# Patient Record
Sex: Female | Born: 1945 | Race: Black or African American | Hispanic: No | Smoking: Never smoker
Health system: Southern US, Community
[De-identification: ages and names within clinical notes are randomized; demographics above are authoritative.]

## PROBLEM LIST (undated history)

## (undated) DIAGNOSIS — F419 Anxiety disorder, unspecified: Secondary | ICD-10-CM

## (undated) DIAGNOSIS — R011 Cardiac murmur, unspecified: Secondary | ICD-10-CM

## (undated) DIAGNOSIS — K219 Gastro-esophageal reflux disease without esophagitis: Secondary | ICD-10-CM

## (undated) DIAGNOSIS — Z8719 Personal history of other diseases of the digestive system: Secondary | ICD-10-CM

## (undated) HISTORY — PX: ABDOMINAL HYSTERECTOMY: SHX81

## (undated) HISTORY — PX: UPPER GI ENDOSCOPY: SHX6162

## (undated) HISTORY — PX: BREAST EXCISIONAL BIOPSY: SUR124

## (undated) HISTORY — PX: COLONOSCOPY: SHX174

---

## 1997-11-28 ENCOUNTER — Inpatient Hospital Stay (HOSPITAL_COMMUNITY): Admission: RE | Admit: 1997-11-28 | Discharge: 1997-12-01 | Payer: Self-pay | Admitting: Obstetrics and Gynecology

## 1999-10-01 ENCOUNTER — Ambulatory Visit (HOSPITAL_COMMUNITY): Admission: RE | Admit: 1999-10-01 | Discharge: 1999-10-01 | Payer: Self-pay | Admitting: Internal Medicine

## 1999-10-01 ENCOUNTER — Encounter: Payer: Self-pay | Admitting: Internal Medicine

## 1999-10-22 ENCOUNTER — Encounter (INDEPENDENT_AMBULATORY_CARE_PROVIDER_SITE_OTHER): Payer: Self-pay | Admitting: *Deleted

## 1999-10-22 ENCOUNTER — Ambulatory Visit (HOSPITAL_COMMUNITY): Admission: RE | Admit: 1999-10-22 | Discharge: 1999-10-22 | Payer: Self-pay | Admitting: Internal Medicine

## 2000-01-17 ENCOUNTER — Ambulatory Visit (HOSPITAL_COMMUNITY): Admission: RE | Admit: 2000-01-17 | Discharge: 2000-01-17 | Payer: Self-pay | Admitting: Emergency Medicine

## 2000-01-30 ENCOUNTER — Encounter: Payer: Self-pay | Admitting: Obstetrics and Gynecology

## 2000-01-30 ENCOUNTER — Encounter: Admission: RE | Admit: 2000-01-30 | Discharge: 2000-01-30 | Payer: Self-pay | Admitting: Obstetrics and Gynecology

## 2000-03-26 ENCOUNTER — Other Ambulatory Visit: Admission: RE | Admit: 2000-03-26 | Discharge: 2000-03-26 | Payer: Self-pay | Admitting: Obstetrics and Gynecology

## 2001-04-15 ENCOUNTER — Encounter: Admission: RE | Admit: 2001-04-15 | Discharge: 2001-04-15 | Payer: Self-pay | Admitting: Obstetrics and Gynecology

## 2001-04-15 ENCOUNTER — Encounter: Payer: Self-pay | Admitting: Obstetrics and Gynecology

## 2001-04-20 ENCOUNTER — Other Ambulatory Visit: Admission: RE | Admit: 2001-04-20 | Discharge: 2001-04-20 | Payer: Self-pay | Admitting: Obstetrics and Gynecology

## 2001-05-12 ENCOUNTER — Encounter: Payer: Self-pay | Admitting: Urology

## 2001-05-12 ENCOUNTER — Encounter: Admission: RE | Admit: 2001-05-12 | Discharge: 2001-05-12 | Payer: Self-pay | Admitting: Urology

## 2001-11-18 ENCOUNTER — Other Ambulatory Visit: Admission: RE | Admit: 2001-11-18 | Discharge: 2001-11-18 | Payer: Self-pay | Admitting: Urology

## 2003-10-09 ENCOUNTER — Emergency Department (HOSPITAL_COMMUNITY): Admission: AD | Admit: 2003-10-09 | Discharge: 2003-10-09 | Payer: Self-pay | Admitting: Internal Medicine

## 2004-09-17 ENCOUNTER — Encounter: Admission: RE | Admit: 2004-09-17 | Discharge: 2004-09-17 | Payer: Self-pay | Admitting: Obstetrics and Gynecology

## 2006-07-31 ENCOUNTER — Encounter: Admission: RE | Admit: 2006-07-31 | Discharge: 2006-07-31 | Payer: Self-pay | Admitting: Emergency Medicine

## 2006-09-11 ENCOUNTER — Ambulatory Visit (HOSPITAL_COMMUNITY): Admission: RE | Admit: 2006-09-11 | Discharge: 2006-09-11 | Payer: Self-pay | Admitting: Emergency Medicine

## 2006-09-11 ENCOUNTER — Encounter (INDEPENDENT_AMBULATORY_CARE_PROVIDER_SITE_OTHER): Payer: Self-pay | Admitting: *Deleted

## 2008-01-13 ENCOUNTER — Emergency Department (HOSPITAL_COMMUNITY): Admission: EM | Admit: 2008-01-13 | Discharge: 2008-01-13 | Payer: Self-pay | Admitting: Emergency Medicine

## 2008-04-25 ENCOUNTER — Emergency Department (HOSPITAL_COMMUNITY): Admission: EM | Admit: 2008-04-25 | Discharge: 2008-04-25 | Payer: Self-pay | Admitting: Emergency Medicine

## 2009-05-29 ENCOUNTER — Emergency Department (HOSPITAL_COMMUNITY): Admission: EM | Admit: 2009-05-29 | Discharge: 2009-05-29 | Payer: Self-pay | Admitting: Family Medicine

## 2010-12-11 ENCOUNTER — Other Ambulatory Visit (HOSPITAL_COMMUNITY): Payer: Self-pay | Admitting: Internal Medicine

## 2010-12-11 DIAGNOSIS — Z1231 Encounter for screening mammogram for malignant neoplasm of breast: Secondary | ICD-10-CM

## 2010-12-19 ENCOUNTER — Ambulatory Visit (HOSPITAL_COMMUNITY)
Admission: RE | Admit: 2010-12-19 | Discharge: 2010-12-19 | Disposition: A | Discharge status: Home / Self Care | Payer: Medicare Other | Source: Ambulatory Visit | Attending: Internal Medicine | Admitting: Internal Medicine

## 2010-12-19 DIAGNOSIS — Z1231 Encounter for screening mammogram for malignant neoplasm of breast: Secondary | ICD-10-CM | POA: Insufficient documentation

## 2011-03-01 NOTE — Procedures (Signed)
[facility]  Patient:    Danielle Sexton                    MRN: [id number] Proc. Date: 10/22/99 Adm. Date:  [id number] Attending:  Mervin Hack CC:         Reuben Likes, M.D.                           Procedure Report  PROCEDURE:  Upper endoscopy and colonoscopy.  INDICATIONS:  This 65 year old black female has history of H.pylori gastropathy, history of gastric ulcer documented on upper endoscopies last one in 1997.  She has a recurrence of epigastric pain.  She has had empirical course of Bioxin and Prevacid for H.pylori in January of 2000, and again five weeks ago with only minimal improvement of her symptoms.  Her ultrasound of the gallbladder was negative.  She is now undergoing upper endoscopy to further avoid her symptoms.  She is also undergoing colonoscopy for neoplastic screening.  ENDOSCOPE:  Fujinon single channel videoscope.  SEDATION:  Versed 9 mg IV.  Demerol 50 mg IV.  FINDINGS:  Fujinon single channel videoscope passed under direct visualization through the posterior pharynx and into the esophagus.  The patient was monitored by pulse oximeter.  Her oxygen saturations were between 98 to 99%.  She was very cooperative.  Proximal and distal esophagus was normal.  Mucosa was glistening.  There were no erosions.  Z-line showed only one short tongue of gastric mucosa.  The gastric site of the GE junction appeared somewhat erythematous.  Biopsies were taken from the tongue of the gastric mucosa.  Stomach.  The stomach was insufflated with air.  There was no significant hiatal hernia.  Gastric folds were normal.  Small amount of bowel exited from the pyloris into the gastric body.  There was mild erythema in the gastric antrum.  Repeat CLOtest was done.  Retroflexion of endoscope revealed normal fundus and cardia.  Duodenum.  Duodenal bulb and descending duodenum was unremarkable.  IMPRESSION:  Gastritis,  status post biopsies and CLOtest.  COLONOSCOPY  ENDOSCOPE:  Fujinon single channel videoscope.  SEDATION:  Versed 3.5 mg IV, Demerol 50 mg IV.  FINDINGS:  The Fujinon single channel videoscope passed under direct vision through the rectum to the sigmoid colon.  The patient was again monitored by pulse oximeter.  Her oxygen saturations were normal.  Her prep was excellent.  Rectum  showed prominent hemorrhoidal veins with one speculation of the dentate line. isx was achieved by retroflexing the endoscope in the rectum.  There was scattered diverticuli throughout the sigmoid colon and also throughout the entire colon including cecum.  The mucosa appeared normal throughout the colon.  There were o polyps.  Colonoscope passed with ease through the splenic flexure, transverse colon, hepatic flexure, through the ascending colon to the cecum.  The cecal pouch was visualized thoroughly including ileocecal valve.  Diverticuli were detected  surrounding the ileocecal valve.  The colonoscope was then slowly retracted and  colon decompressed.  The patient withstood the procedure well.  IMPRESSION: 1. Mild Universal diverticulosis of the left and right colon. 2. Small internal hemorrhoids.  PLAN:  1) The upper endoscopy as well as colonoscopy shows significant lesions. The epigastric pain is most likely functional.  We will add antispasmotics.  As far as the diverticulosis is concerned, the patient was given high fiber diet and suggested to begin fiber  supplements to minimize the progression of the diverticulosis.  I suggested repeat colonoscopy in 10 years. DD:  10/22/99 TD:  10/22/99 Job: [id number]

## 2011-11-20 ENCOUNTER — Other Ambulatory Visit (HOSPITAL_COMMUNITY): Payer: Self-pay | Admitting: Nurse Practitioner

## 2011-11-20 DIAGNOSIS — Z1231 Encounter for screening mammogram for malignant neoplasm of breast: Secondary | ICD-10-CM

## 2011-12-24 ENCOUNTER — Ambulatory Visit (HOSPITAL_COMMUNITY)
Admission: RE | Admit: 2011-12-24 | Discharge: 2011-12-24 | Disposition: A | Discharge status: Home / Self Care | Payer: Medicare Other | Source: Ambulatory Visit | Attending: Nurse Practitioner | Admitting: Nurse Practitioner

## 2011-12-24 DIAGNOSIS — Z1231 Encounter for screening mammogram for malignant neoplasm of breast: Secondary | ICD-10-CM | POA: Insufficient documentation

## 2011-12-30 ENCOUNTER — Ambulatory Visit (HOSPITAL_COMMUNITY): Payer: Medicare Other

## 2014-04-12 ENCOUNTER — Other Ambulatory Visit (HOSPITAL_COMMUNITY): Payer: Self-pay | Admitting: Internal Medicine

## 2014-04-12 DIAGNOSIS — Z1231 Encounter for screening mammogram for malignant neoplasm of breast: Secondary | ICD-10-CM

## 2014-04-14 ENCOUNTER — Ambulatory Visit (HOSPITAL_COMMUNITY)
Admission: RE | Admit: 2014-04-14 | Discharge: 2014-04-14 | Disposition: A | Discharge status: Home / Self Care | Payer: Medicare HMO | Source: Ambulatory Visit | Attending: Internal Medicine | Admitting: Internal Medicine

## 2014-04-14 DIAGNOSIS — Z1231 Encounter for screening mammogram for malignant neoplasm of breast: Secondary | ICD-10-CM | POA: Insufficient documentation

## 2015-12-12 DIAGNOSIS — I119 Hypertensive heart disease without heart failure: Secondary | ICD-10-CM | POA: Diagnosis not present

## 2015-12-12 DIAGNOSIS — I10 Essential (primary) hypertension: Secondary | ICD-10-CM | POA: Diagnosis not present

## 2016-01-11 DIAGNOSIS — I119 Hypertensive heart disease without heart failure: Secondary | ICD-10-CM | POA: Diagnosis not present

## 2016-01-11 DIAGNOSIS — I10 Essential (primary) hypertension: Secondary | ICD-10-CM | POA: Diagnosis not present

## 2016-01-12 DIAGNOSIS — Z136 Encounter for screening for cardiovascular disorders: Secondary | ICD-10-CM | POA: Diagnosis not present

## 2016-01-12 DIAGNOSIS — I10 Essential (primary) hypertension: Secondary | ICD-10-CM | POA: Diagnosis not present

## 2016-01-12 DIAGNOSIS — Z0100 Encounter for examination of eyes and vision without abnormal findings: Secondary | ICD-10-CM | POA: Diagnosis not present

## 2016-01-12 DIAGNOSIS — K219 Gastro-esophageal reflux disease without esophagitis: Secondary | ICD-10-CM | POA: Diagnosis not present

## 2016-01-12 DIAGNOSIS — I119 Hypertensive heart disease without heart failure: Secondary | ICD-10-CM | POA: Diagnosis not present

## 2016-01-12 DIAGNOSIS — Z1389 Encounter for screening for other disorder: Secondary | ICD-10-CM | POA: Diagnosis not present

## 2016-01-12 DIAGNOSIS — F419 Anxiety disorder, unspecified: Secondary | ICD-10-CM | POA: Diagnosis not present

## 2016-01-12 DIAGNOSIS — Z0000 Encounter for general adult medical examination without abnormal findings: Secondary | ICD-10-CM | POA: Diagnosis not present

## 2016-01-12 DIAGNOSIS — Z01118 Encounter for examination of ears and hearing with other abnormal findings: Secondary | ICD-10-CM | POA: Diagnosis not present

## 2016-01-12 DIAGNOSIS — Z131 Encounter for screening for diabetes mellitus: Secondary | ICD-10-CM | POA: Diagnosis not present

## 2016-01-18 DIAGNOSIS — I10 Essential (primary) hypertension: Secondary | ICD-10-CM | POA: Diagnosis not present

## 2016-01-18 DIAGNOSIS — I119 Hypertensive heart disease without heart failure: Secondary | ICD-10-CM | POA: Diagnosis not present

## 2016-02-08 DIAGNOSIS — H04123 Dry eye syndrome of bilateral lacrimal glands: Secondary | ICD-10-CM | POA: Diagnosis not present

## 2016-02-08 DIAGNOSIS — H40033 Anatomical narrow angle, bilateral: Secondary | ICD-10-CM | POA: Diagnosis not present

## 2016-02-15 DIAGNOSIS — I10 Essential (primary) hypertension: Secondary | ICD-10-CM | POA: Diagnosis not present

## 2016-02-15 DIAGNOSIS — I119 Hypertensive heart disease without heart failure: Secondary | ICD-10-CM | POA: Diagnosis not present

## 2016-03-18 DIAGNOSIS — I10 Essential (primary) hypertension: Secondary | ICD-10-CM | POA: Diagnosis not present

## 2016-03-18 DIAGNOSIS — I119 Hypertensive heart disease without heart failure: Secondary | ICD-10-CM | POA: Diagnosis not present

## 2016-04-18 DIAGNOSIS — I119 Hypertensive heart disease without heart failure: Secondary | ICD-10-CM | POA: Diagnosis not present

## 2016-04-18 DIAGNOSIS — I10 Essential (primary) hypertension: Secondary | ICD-10-CM | POA: Diagnosis not present

## 2018-05-04 DIAGNOSIS — I10 Essential (primary) hypertension: Secondary | ICD-10-CM | POA: Diagnosis not present

## 2018-05-04 DIAGNOSIS — M1611 Unilateral primary osteoarthritis, right hip: Secondary | ICD-10-CM | POA: Diagnosis not present

## 2018-10-05 ENCOUNTER — Other Ambulatory Visit: Payer: Self-pay | Admitting: Family Medicine

## 2018-10-05 DIAGNOSIS — Z1231 Encounter for screening mammogram for malignant neoplasm of breast: Secondary | ICD-10-CM

## 2018-10-27 DIAGNOSIS — E7800 Pure hypercholesterolemia, unspecified: Secondary | ICD-10-CM | POA: Diagnosis not present

## 2018-10-27 DIAGNOSIS — Z0000 Encounter for general adult medical examination without abnormal findings: Secondary | ICD-10-CM | POA: Diagnosis not present

## 2018-10-27 DIAGNOSIS — I10 Essential (primary) hypertension: Secondary | ICD-10-CM | POA: Diagnosis not present

## 2018-10-27 DIAGNOSIS — Z1389 Encounter for screening for other disorder: Secondary | ICD-10-CM | POA: Diagnosis not present

## 2018-10-29 DIAGNOSIS — H524 Presbyopia: Secondary | ICD-10-CM | POA: Diagnosis not present

## 2018-11-10 ENCOUNTER — Ambulatory Visit
Admission: RE | Admit: 2018-11-10 | Discharge: 2018-11-10 | Disposition: A | Discharge status: Home / Self Care | Payer: Medicare HMO | Source: Ambulatory Visit | Attending: Family Medicine | Admitting: Family Medicine

## 2018-11-10 DIAGNOSIS — Z1231 Encounter for screening mammogram for malignant neoplasm of breast: Secondary | ICD-10-CM | POA: Diagnosis not present

## 2019-03-23 DIAGNOSIS — H5203 Hypermetropia, bilateral: Secondary | ICD-10-CM | POA: Diagnosis not present

## 2019-03-23 DIAGNOSIS — H52209 Unspecified astigmatism, unspecified eye: Secondary | ICD-10-CM | POA: Diagnosis not present

## 2019-03-23 DIAGNOSIS — H524 Presbyopia: Secondary | ICD-10-CM | POA: Diagnosis not present

## 2019-04-20 DIAGNOSIS — I10 Essential (primary) hypertension: Secondary | ICD-10-CM | POA: Diagnosis not present

## 2019-05-11 DIAGNOSIS — I10 Essential (primary) hypertension: Secondary | ICD-10-CM | POA: Diagnosis not present

## 2019-05-11 DIAGNOSIS — Z23 Encounter for immunization: Secondary | ICD-10-CM | POA: Diagnosis not present

## 2019-05-17 DIAGNOSIS — T7840XA Allergy, unspecified, initial encounter: Secondary | ICD-10-CM | POA: Diagnosis not present

## 2019-05-17 DIAGNOSIS — R42 Dizziness and giddiness: Secondary | ICD-10-CM | POA: Diagnosis not present

## 2019-05-24 DIAGNOSIS — T7840XD Allergy, unspecified, subsequent encounter: Secondary | ICD-10-CM | POA: Diagnosis not present

## 2019-11-11 DIAGNOSIS — I10 Essential (primary) hypertension: Secondary | ICD-10-CM | POA: Diagnosis not present

## 2019-11-11 DIAGNOSIS — J309 Allergic rhinitis, unspecified: Secondary | ICD-10-CM | POA: Diagnosis not present

## 2019-11-11 DIAGNOSIS — Z0000 Encounter for general adult medical examination without abnormal findings: Secondary | ICD-10-CM | POA: Diagnosis not present

## 2019-11-11 DIAGNOSIS — Z1389 Encounter for screening for other disorder: Secondary | ICD-10-CM | POA: Diagnosis not present

## 2019-11-11 DIAGNOSIS — M1710 Unilateral primary osteoarthritis, unspecified knee: Secondary | ICD-10-CM | POA: Diagnosis not present

## 2019-11-11 DIAGNOSIS — E7800 Pure hypercholesterolemia, unspecified: Secondary | ICD-10-CM | POA: Diagnosis not present

## 2019-11-15 DIAGNOSIS — E7800 Pure hypercholesterolemia, unspecified: Secondary | ICD-10-CM | POA: Diagnosis not present

## 2019-11-15 DIAGNOSIS — I10 Essential (primary) hypertension: Secondary | ICD-10-CM | POA: Diagnosis not present

## 2019-11-17 DIAGNOSIS — M1710 Unilateral primary osteoarthritis, unspecified knee: Secondary | ICD-10-CM | POA: Diagnosis not present

## 2019-11-17 DIAGNOSIS — I10 Essential (primary) hypertension: Secondary | ICD-10-CM | POA: Diagnosis not present

## 2019-11-17 DIAGNOSIS — M1611 Unilateral primary osteoarthritis, right hip: Secondary | ICD-10-CM | POA: Diagnosis not present

## 2019-11-17 DIAGNOSIS — E7800 Pure hypercholesterolemia, unspecified: Secondary | ICD-10-CM | POA: Diagnosis not present

## 2020-01-05 DIAGNOSIS — E7800 Pure hypercholesterolemia, unspecified: Secondary | ICD-10-CM | POA: Diagnosis not present

## 2020-01-05 DIAGNOSIS — M1611 Unilateral primary osteoarthritis, right hip: Secondary | ICD-10-CM | POA: Diagnosis not present

## 2020-01-05 DIAGNOSIS — M1710 Unilateral primary osteoarthritis, unspecified knee: Secondary | ICD-10-CM | POA: Diagnosis not present

## 2020-01-05 DIAGNOSIS — I10 Essential (primary) hypertension: Secondary | ICD-10-CM | POA: Diagnosis not present

## 2020-03-03 DIAGNOSIS — M1710 Unilateral primary osteoarthritis, unspecified knee: Secondary | ICD-10-CM | POA: Diagnosis not present

## 2020-03-03 DIAGNOSIS — E7800 Pure hypercholesterolemia, unspecified: Secondary | ICD-10-CM | POA: Diagnosis not present

## 2020-03-03 DIAGNOSIS — I10 Essential (primary) hypertension: Secondary | ICD-10-CM | POA: Diagnosis not present

## 2020-03-03 DIAGNOSIS — M1611 Unilateral primary osteoarthritis, right hip: Secondary | ICD-10-CM | POA: Diagnosis not present

## 2020-04-10 DIAGNOSIS — M25561 Pain in right knee: Secondary | ICD-10-CM | POA: Diagnosis not present

## 2020-04-10 DIAGNOSIS — M25551 Pain in right hip: Secondary | ICD-10-CM | POA: Diagnosis not present

## 2020-04-10 DIAGNOSIS — R011 Cardiac murmur, unspecified: Secondary | ICD-10-CM | POA: Diagnosis not present

## 2020-04-10 DIAGNOSIS — E785 Hyperlipidemia, unspecified: Secondary | ICD-10-CM | POA: Diagnosis not present

## 2020-04-10 DIAGNOSIS — M25562 Pain in left knee: Secondary | ICD-10-CM | POA: Diagnosis not present

## 2020-04-10 DIAGNOSIS — I10 Essential (primary) hypertension: Secondary | ICD-10-CM | POA: Diagnosis not present

## 2020-04-13 DIAGNOSIS — M545 Low back pain: Secondary | ICD-10-CM | POA: Diagnosis not present

## 2020-04-13 DIAGNOSIS — M25561 Pain in right knee: Secondary | ICD-10-CM | POA: Diagnosis not present

## 2020-04-13 DIAGNOSIS — M25562 Pain in left knee: Secondary | ICD-10-CM | POA: Diagnosis not present

## 2020-04-13 DIAGNOSIS — M25551 Pain in right hip: Secondary | ICD-10-CM | POA: Diagnosis not present

## 2020-04-28 DIAGNOSIS — R011 Cardiac murmur, unspecified: Secondary | ICD-10-CM | POA: Diagnosis not present

## 2020-06-04 IMAGING — MG DIGITAL SCREENING BILATERAL MAMMOGRAM WITH TOMO AND CAD
8 series · 8 of 24 positions shown · non-contrast
Comparison: Previous exam(s).

CLINICAL DATA: Screening.

EXAM:
DIGITAL SCREENING BILATERAL MAMMOGRAM WITH TOMO AND CAD

[R MLO synth-2D]
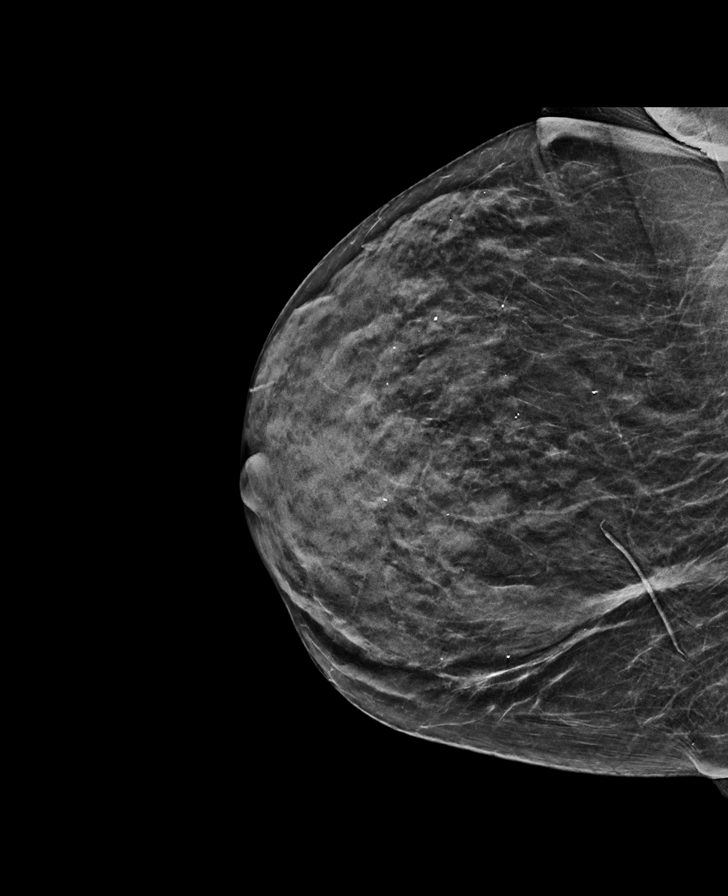

[R CC synth-2D]
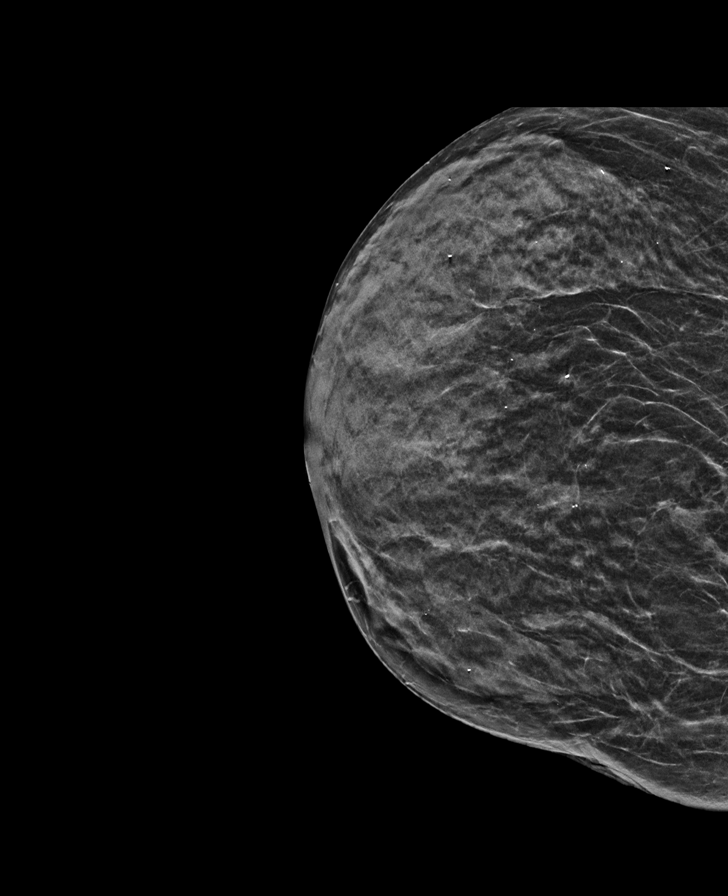

[L CC synth-2D]
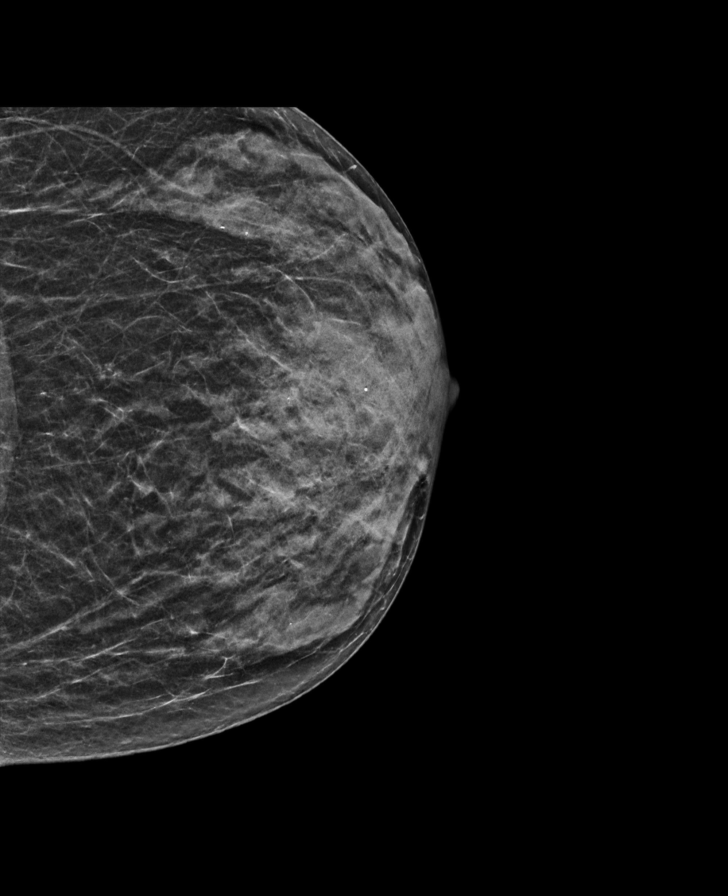

[L MLO synth-2D]
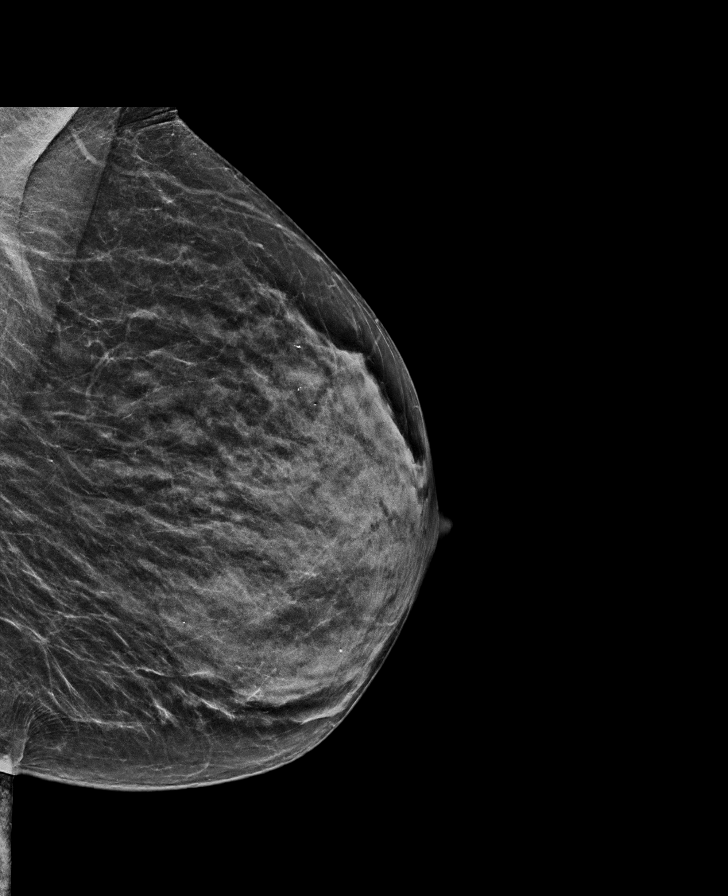

[R CC tomo · tomo slice 23/46.0]
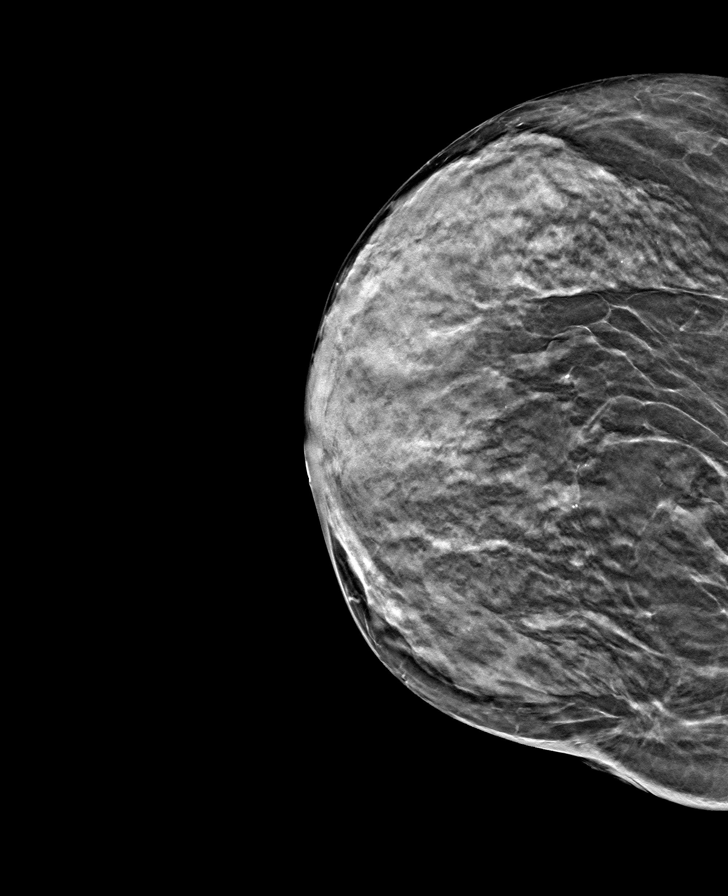

[L MLO tomo · tomo slice 29/57.0]
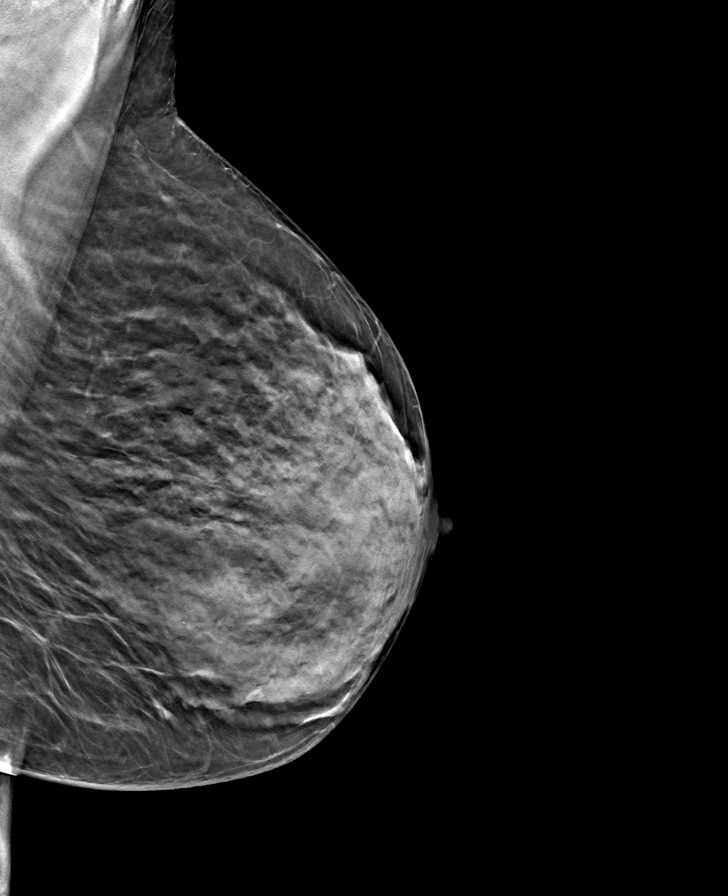

[R MLO tomo · tomo slice 27/53.0]
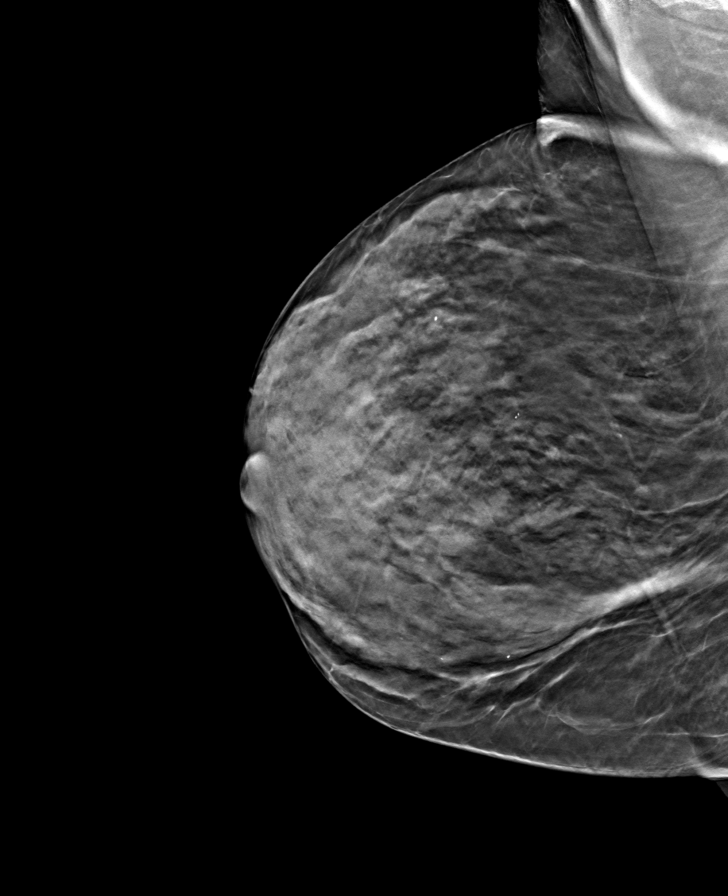

[L CC tomo · tomo slice 24/47.0]
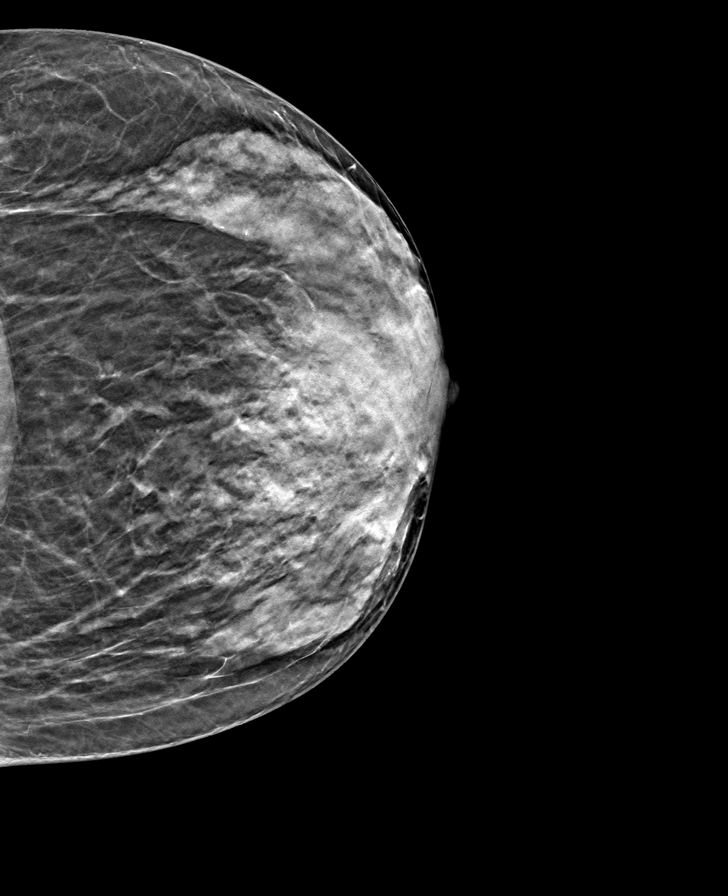

[8 of 24 positions shown; findings below may reference images not displayed]

ACR Breast Density Category c: The breast tissue is heterogeneously
dense, which may obscure small masses.
FINDINGS: There are no findings suspicious for malignancy. Images were
processed with CAD.
IMPRESSION: No mammographic evidence of malignancy. A result letter of this
screening mammogram will be mailed directly to the patient.

RECOMMENDATION:
Screening mammogram in one year. (Code:[id number]-U-LHB)

BI-RADS CATEGORY  1: Negative.

## 2020-06-12 DIAGNOSIS — E7800 Pure hypercholesterolemia, unspecified: Secondary | ICD-10-CM | POA: Diagnosis not present

## 2020-06-12 DIAGNOSIS — M1611 Unilateral primary osteoarthritis, right hip: Secondary | ICD-10-CM | POA: Diagnosis not present

## 2020-06-12 DIAGNOSIS — M1710 Unilateral primary osteoarthritis, unspecified knee: Secondary | ICD-10-CM | POA: Diagnosis not present

## 2020-06-12 DIAGNOSIS — I10 Essential (primary) hypertension: Secondary | ICD-10-CM | POA: Diagnosis not present

## 2020-06-12 DIAGNOSIS — E785 Hyperlipidemia, unspecified: Secondary | ICD-10-CM | POA: Diagnosis not present

## 2020-11-10 DIAGNOSIS — H9313 Tinnitus, bilateral: Secondary | ICD-10-CM | POA: Insufficient documentation

## 2020-11-10 DIAGNOSIS — H903 Sensorineural hearing loss, bilateral: Secondary | ICD-10-CM | POA: Diagnosis not present

## 2020-11-10 DIAGNOSIS — H9113 Presbycusis, bilateral: Secondary | ICD-10-CM | POA: Diagnosis not present

## 2020-11-22 DIAGNOSIS — E785 Hyperlipidemia, unspecified: Secondary | ICD-10-CM | POA: Diagnosis not present

## 2020-11-22 DIAGNOSIS — E7800 Pure hypercholesterolemia, unspecified: Secondary | ICD-10-CM | POA: Diagnosis not present

## 2020-11-22 DIAGNOSIS — M1611 Unilateral primary osteoarthritis, right hip: Secondary | ICD-10-CM | POA: Diagnosis not present

## 2020-11-22 DIAGNOSIS — I10 Essential (primary) hypertension: Secondary | ICD-10-CM | POA: Diagnosis not present

## 2020-11-22 DIAGNOSIS — M1710 Unilateral primary osteoarthritis, unspecified knee: Secondary | ICD-10-CM | POA: Diagnosis not present

## 2020-12-07 ENCOUNTER — Other Ambulatory Visit: Payer: Self-pay | Admitting: Family Medicine

## 2020-12-07 DIAGNOSIS — Z1231 Encounter for screening mammogram for malignant neoplasm of breast: Secondary | ICD-10-CM

## 2021-01-29 DIAGNOSIS — J309 Allergic rhinitis, unspecified: Secondary | ICD-10-CM | POA: Diagnosis not present

## 2021-01-29 DIAGNOSIS — I10 Essential (primary) hypertension: Secondary | ICD-10-CM | POA: Diagnosis not present

## 2021-01-29 DIAGNOSIS — E7800 Pure hypercholesterolemia, unspecified: Secondary | ICD-10-CM | POA: Diagnosis not present

## 2021-01-29 DIAGNOSIS — Z1211 Encounter for screening for malignant neoplasm of colon: Secondary | ICD-10-CM | POA: Diagnosis not present

## 2021-01-29 DIAGNOSIS — Z1159 Encounter for screening for other viral diseases: Secondary | ICD-10-CM | POA: Diagnosis not present

## 2021-01-29 DIAGNOSIS — Z0000 Encounter for general adult medical examination without abnormal findings: Secondary | ICD-10-CM | POA: Diagnosis not present

## 2021-01-29 DIAGNOSIS — M25561 Pain in right knee: Secondary | ICD-10-CM | POA: Diagnosis not present

## 2021-01-29 DIAGNOSIS — Z1389 Encounter for screening for other disorder: Secondary | ICD-10-CM | POA: Diagnosis not present

## 2021-01-29 DIAGNOSIS — M25562 Pain in left knee: Secondary | ICD-10-CM | POA: Diagnosis not present

## 2021-02-01 ENCOUNTER — Ambulatory Visit
Admission: RE | Admit: 2021-02-01 | Discharge: 2021-02-01 | Disposition: A | Discharge status: Home / Self Care | Payer: Medicare HMO | Source: Ambulatory Visit | Attending: Family Medicine | Admitting: Family Medicine

## 2021-02-01 ENCOUNTER — Other Ambulatory Visit: Payer: Self-pay

## 2021-02-01 DIAGNOSIS — Z1231 Encounter for screening mammogram for malignant neoplasm of breast: Secondary | ICD-10-CM | POA: Diagnosis not present

## 2021-03-20 DIAGNOSIS — Z86010 Personal history of colonic polyps: Secondary | ICD-10-CM | POA: Diagnosis not present

## 2021-03-20 DIAGNOSIS — R1310 Dysphagia, unspecified: Secondary | ICD-10-CM | POA: Diagnosis not present

## 2021-04-02 DIAGNOSIS — I10 Essential (primary) hypertension: Secondary | ICD-10-CM | POA: Diagnosis not present

## 2021-04-02 DIAGNOSIS — M1710 Unilateral primary osteoarthritis, unspecified knee: Secondary | ICD-10-CM | POA: Diagnosis not present

## 2021-04-02 DIAGNOSIS — M1611 Unilateral primary osteoarthritis, right hip: Secondary | ICD-10-CM | POA: Diagnosis not present

## 2021-04-02 DIAGNOSIS — E7800 Pure hypercholesterolemia, unspecified: Secondary | ICD-10-CM | POA: Diagnosis not present

## 2021-04-02 DIAGNOSIS — E785 Hyperlipidemia, unspecified: Secondary | ICD-10-CM | POA: Diagnosis not present

## 2021-04-03 ENCOUNTER — Encounter: Payer: Self-pay | Admitting: Podiatry

## 2021-04-03 ENCOUNTER — Ambulatory Visit (INDEPENDENT_AMBULATORY_CARE_PROVIDER_SITE_OTHER): Payer: Medicare HMO

## 2021-04-03 ENCOUNTER — Ambulatory Visit: Payer: Medicare HMO | Admitting: Podiatry

## 2021-04-03 ENCOUNTER — Other Ambulatory Visit: Payer: Self-pay

## 2021-04-03 DIAGNOSIS — M79672 Pain in left foot: Secondary | ICD-10-CM

## 2021-04-03 DIAGNOSIS — M79675 Pain in left toe(s): Secondary | ICD-10-CM | POA: Diagnosis not present

## 2021-04-03 DIAGNOSIS — B351 Tinea unguium: Secondary | ICD-10-CM

## 2021-04-03 DIAGNOSIS — M7662 Achilles tendinitis, left leg: Secondary | ICD-10-CM

## 2021-04-03 DIAGNOSIS — M79671 Pain in right foot: Secondary | ICD-10-CM

## 2021-04-03 DIAGNOSIS — G5763 Lesion of plantar nerve, bilateral lower limbs: Secondary | ICD-10-CM | POA: Diagnosis not present

## 2021-04-03 DIAGNOSIS — M79674 Pain in right toe(s): Secondary | ICD-10-CM

## 2021-04-03 NOTE — Patient Instructions (Signed)
[place] [address]  HOURS M-F  9-5:30 [place] [facility] [employer] CALL us [phone]   Achilles Tendinitis  with Rehab Achilles tendinitis is a disorder of the Achilles tendon. The Achilles tendon connects the large calf muscles (Gastrocnemius and Soleus) to the heel bone (calcaneus). This tendon is sometimes called the heel cord. It is important for pushing-off and standing on your toes and is important for walking, running, or jumping. Tendinitis is often caused by overuse and repetitive microtrauma. SYMPTOMS Pain, tenderness, swelling, warmth, and redness may occur over the Achilles tendon even at rest. Pain with pushing off, or flexing or extending the ankle. Pain that is worsened after or during activity. CAUSES  Overuse sometimes seen with rapid increase in exercise programs or in sports requiring running and jumping. Poor physical conditioning (strength and flexibility or endurance). Running sports, especially training running down hills. Inadequate warm-up before practice or play or failure to stretch before participation. Injury to the tendon. PREVENTION  Warm up and stretch before practice or competition. Allow time for adequate rest and recovery between practices and competition. Keep up conditioning. Keep up ankle and leg flexibility. Improve or keep muscle strength and endurance. Improve cardiovascular fitness. Use proper technique. Use proper equipment (shoes, skates). To help prevent recurrence, taping, protective strapping, or an adhesive bandage may be recommended for several weeks after healing is complete. PROGNOSIS  Recovery may take weeks to several months to heal. Longer recovery is expected if symptoms have been prolonged. Recovery is usually quicker if the inflammation is due to a direct blow as compared with overuse or sudden strain. RELATED COMPLICATIONS  Healing time will be  prolonged if the condition is not correctly treated. The injury must be given plenty of time to heal. Symptoms can reoccur if activity is resumed too soon. Untreated, tendinitis may increase the risk of tendon rupture requiring additional time for recovery and possibly surgery. TREATMENT  The first treatment consists of rest anti-inflammatory medication, and ice to relieve the pain. Stretching and strengthening exercises after resolution of pain will likely help reduce the risk of recurrence. Referral to a physical therapist or athletic trainer for further evaluation and treatment may be helpful. A walking boot or cast may be recommended to rest the Achilles tendon. This can help break the cycle of inflammation and microtrauma. Arch supports (orthotics) may be prescribed or recommended by your caregiver as an adjunct to therapy and rest. Surgery to remove the inflamed tendon lining or degenerated tendon tissue is rarely necessary and has shown less than predictable results. MEDICATION  Nonsteroidal anti-inflammatory medications, such as aspirin and ibuprofen, may be used for pain and inflammation relief. Do not take within 7 days before surgery. Take these as directed by your caregiver. Contact your caregiver immediately if any bleeding, stomach upset, or signs of allergic reaction occur. Other minor pain relievers, such as acetaminophen, may also be used. Pain relievers may be prescribed as necessary by your caregiver. Do not take prescription pain medication for longer than 4 to 7 days. Use only as directed and only as much as you need. Cortisone injections are rarely indicated. Cortisone injections may weaken tendons and predispose to rupture. It is better to give the condition more time to heal than to use them. HEAT AND COLD Cold is used to relieve pain and reduce inflammation for acute and chronic Achilles tendinitis. Cold should be applied for 10 to 15 minutes every 2 to 3 hours for  inflammation  and pain and immediately after any activity that aggravates your symptoms. Use ice packs or an ice massage. Heat may be used before performing stretching and strengthening activities prescribed by your caregiver. Use a heat pack or a warm soak. SEEK MEDICAL CARE IF: Symptoms get worse or do not improve in 2 weeks despite treatment. New, unexplained symptoms develop. Drugs used in treatment may produce side effects.  EXERCISES:  RANGE OF MOTION (ROM) AND STRETCHING EXERCISES - Achilles Tendinitis  These exercises may help you when beginning to rehabilitate your injury. Your symptoms may resolve with or without further involvement from your physician, physical therapist or athletic trainer. While completing these exercises, remember:  Restoring tissue flexibility helps normal motion to return to the joints. This allows healthier, less painful movement and activity. An effective stretch should be held for at least 30 seconds. A stretch should never be painful. You should only feel a gentle lengthening or release in the stretched tissue.  STRETCH  Gastroc, Standing  Place hands on wall. Extend right / left leg, keeping the front knee somewhat bent. Slightly point your toes inward on your back foot. Keeping your right / left heel on the floor and your knee straight, shift your weight toward the wall, not allowing your back to arch. You should feel a gentle stretch in the right / left calf. Hold this position for 10 seconds. Repeat 3 times. Complete this stretch 2 times per day.  STRETCH  Soleus, Standing  Place hands on wall. Extend right / left leg, keeping the other knee somewhat bent. Slightly point your toes inward on your back foot. Keep your right / left heel on the floor, bend your back knee, and slightly shift your weight over the back leg so that you feel a gentle stretch deep in your back calf. Hold this position for 10 seconds. Repeat 3 times. Complete this stretch 2 times per  day.  STRETCH  Gastrocsoleus, Standing  Note: This exercise can place a lot of stress on your foot and ankle. Please complete this exercise only if specifically instructed by your caregiver.  Place the ball of your right / left foot on a step, keeping your other foot firmly on the same step. Hold on to the wall or a rail for balance. Slowly lift your other foot, allowing your body weight to press your heel down over the edge of the step. You should feel a stretch in your right / left calf. Hold this position for 10 seconds. Repeat this exercise with a slight bend in your knee. Repeat 3 times. Complete this stretch 2 times per day.   STRENGTHENING EXERCISES - Achilles Tendinitis These exercises may help you when beginning to rehabilitate your injury. They may resolve your symptoms with or without further involvement from your physician, physical therapist or athletic trainer. While completing these exercises, remember:  Muscles can gain both the endurance and the strength needed for everyday activities through controlled exercises. Complete these exercises as instructed by your physician, physical therapist or athletic trainer. Progress the resistance and repetitions only as guided. You may experience muscle soreness or fatigue, but the pain or discomfort you are trying to eliminate should never worsen during these exercises. If this pain does worsen, stop and make certain you are following the directions exactly. If the pain is still present after adjustments, discontinue the exercise until you can discuss the trouble with your clinician.  STRENGTH - Plantar-flexors  Sit with your right / left leg  extended. Holding onto both ends of a rubber exercise band/tubing, loop it around the ball of your foot. Keep a slight tension in the band. Slowly push your toes away from you, pointing them downward. Hold this position for 10 seconds. Return slowly, controlling the tension in the band/tubing. Repeat  3 times. Complete this exercise 2 times per day.   STRENGTH - Plantar-flexors  Stand with your feet shoulder width apart. Steady yourself with a wall or table using as little support as needed. Keeping your weight evenly spread over the width of your feet, rise up on your toes.* Hold this position for 10 seconds. Repeat 3 times. Complete this exercise 2 times per day.  *If this is too easy, shift your weight toward your right / left leg until you feel challenged. Ultimately, you may be asked to do this exercise with your right / left foot only.  STRENGTH  Plantar-flexors, Eccentric  Note: This exercise can place a lot of stress on your foot and ankle. Please complete this exercise only if specifically instructed by your caregiver.  Place the balls of your feet on a step. With your hands, use only enough support from a wall or rail to keep your balance. Keep your knees straight and rise up on your toes. Slowly shift your weight entirely to your right / left toes and pick up your opposite foot. Gently and with controlled movement, lower your weight through your right / left foot so that your heel drops below the level of the step. You will feel a slight stretch in the back of your calf at the end position. Use the healthy leg to help rise up onto the balls of both feet, then lower weight only on the right / left leg again. Build up to 15 repetitions. Then progress to 3 consecutive sets of 15 repetitions.* After completing the above exercise, complete the same exercise with a slight knee bend (about 30 degrees). Again, build up to 15 repetitions. Then progress to 3 consecutive sets of 15 repetitions.* Perform this exercise 2 times per day.  *When you easily complete 3 sets of 15, your physician, physical therapist or athletic trainer may advise you to add resistance by wearing a backpack filled with additional weight.  STRENGTH - Plantar Flexors, Seated  Sit on a chair that allows your feet to rest  flat on the ground. If necessary, sit at the edge of the chair. Keeping your toes firmly on the ground, lift your right / left heel as far as you can without increasing any discomfort in your ankle. Repeat 3 times. Complete this exercise 2 times a day.

## 2021-04-03 NOTE — Progress Notes (Signed)
  Subjective:  Patient ID: Danielle Sexton, female    DOB: [birth year],  MRN: [id number]  Chief Complaint  Patient presents with   Foot Pain    BILATERAL FOOT PAIN, NAIL TRIM    75 y.o. female presents with the above complaint. History confirmed with patient.  She feels numbness towards the front of the foot.  She has ankle pain in the left which is towards the back of the heel and ankle.  This swells as well.  She has some redness around here.  She has difficulty cutting her nails because they are very thick and they are also discolored.  Objective:  Physical Exam: warm, good capillary refill, no trophic changes or ulcerative lesions, normal DP and PT pulses, and normal sensory exam.  Bilaterally she has onychomycosis with thickened elongated brown-yellow discoloration of the toenails, 6 nails affected Left Foot: Mild edema around the left ankle, she has pain on palpation of the insertion of Achilles tendon at the plantar fascia   Radiographs: Multiple views x-ray of both feet: no fracture, dislocation, swelling or degenerative changes noted Assessment:   1. Morton metatarsalgia, bilateral   2. Achilles tendinitis of left lower extremity   3. Pain due to onychomycosis of toenails of both feet      Plan:  Patient was evaluated and treated and all questions answered.  Discussed with her the pain she is having in the forefoot is likely secondary to fat pad atrophy and metatarsalgia.  Recommended a gel insole to put in the shoe she can get from the pharmacy over-the-counter.  Could consider custom orthotics but thinks may be cost prohibitive for her.  Discussed the etiology and treatment options for the condition in detail with the patient. Educated patient on the topical and oral treatment options for mycotic nails. Recommended debridement of the nails today. Sharp and mechanical debridement performed of all painful and mycotic nails today. Nails debrided in length and thickness using  a nail nipper to level of comfort. Discussed treatment options including appropriate shoe gear. Follow up as needed for painful nails.  We will have her scheduled for regular nail debridement which I think will be helpful  Discussed the etiology and treatment options for Achilles tendinitis including stretching, formal physical therapy with an eccentric exercises therapy plan, supportive shoegears such as a running shoe or sneaker, heel lifts, topical and oral medications.  We also discussed that I do not routinely perform injections in this area because of the risk of an increased damage or rupture of the tendon.  We also discussed the role of surgical treatment of this for patients who do not improve after exhausting non-surgical treatment options.  -XR reviewed with patient -Educated on stretching and icing of the affected limb.   Return in about 6 weeks (around 05/15/2021) for re-check Achilles tendon.

## 2021-04-19 ENCOUNTER — Ambulatory Visit: Payer: Medicare HMO | Attending: Podiatry | Admitting: Physical Therapy

## 2021-04-19 ENCOUNTER — Encounter: Payer: Self-pay | Admitting: Physical Therapy

## 2021-04-19 ENCOUNTER — Other Ambulatory Visit: Payer: Self-pay

## 2021-04-19 DIAGNOSIS — R252 Cramp and spasm: Secondary | ICD-10-CM | POA: Diagnosis not present

## 2021-04-19 DIAGNOSIS — M6281 Muscle weakness (generalized): Secondary | ICD-10-CM

## 2021-04-19 DIAGNOSIS — R2681 Unsteadiness on feet: Secondary | ICD-10-CM

## 2021-04-19 DIAGNOSIS — R262 Difficulty in walking, not elsewhere classified: Secondary | ICD-10-CM

## 2021-04-19 DIAGNOSIS — M25572 Pain in left ankle and joints of left foot: Secondary | ICD-10-CM | POA: Diagnosis not present

## 2021-04-19 NOTE — Therapy (Signed)
[place], [place], [address] Phone: [phone]   Fax:  [phone]  Physical Therapy Evaluation  Patient Details  Name: Danielle Sexton MRN: [id number] Date of Birth: 13-Dec-1945 Referring Provider (PT): Newton Pigg R.DPM   Encounter Date: 04/19/2021   PT End of Session - 04/19/21 1313     Visit Number 1    Number of Visits 16    Date for PT Re-Evaluation 06/14/21    Authorization Type [employer] MCR    PT Start Time 1017    PT Stop Time 1100    PT Time Calculation (min) 43 min    Activity Tolerance Patient tolerated treatment well    Behavior During Therapy [facility] for tasks assessed/performed             History reviewed. No pertinent past medical history.  Past Surgical History:  Procedure Laterality Date   BREAST EXCISIONAL BIOPSY Right    benign    There were no vitals filed for this visit.    Subjective Assessment - 04/19/21 1025     Subjective I have been having pain in my left ankle for last 3 months.   I used to exercise at the [facility] center but I haven't been because I hurt so bad.    Pertinent History nothing remarkable, diverticulitis, HTN    Limitations Standing;Walking;House hold activities    How long can you sit comfortably? unlimited    How long can you stand comfortably? 5-10 minutes    How long can you walk comfortably? I cant walk comfortable at all    Patient Stated Goals I want to be able to return to exercise at [facility].  I want to be able to rise from my chair better.  I want to walk without pain    Currently in Pain? Yes    Pain Score 7     Pain Location Ankle   achilles tenonitis   Pain Orientation Left    Pain Descriptors / Indicators Aching;Sharp;Constant    Pain Type Chronic pain    Pain Onset More than a month ago    Pain Frequency Constant    Aggravating Factors  standing and walking                [facility] PT Assessment - 04/19/21 0001        Assessment   Medical Diagnosis Achilles Tendonitis of L LE    Referring Provider (PT) Newton Pigg R.DPM    Onset Date/Surgical Date 01/17/21    Hand Dominance Right    Prior Therapy none      Precautions   Precautions None      Restrictions   Weight Bearing Restrictions No      Balance Screen   Has the patient fallen in the past 6 months No    Has the patient had a decrease in activity level because of a fear of falling?  No    Is the patient reluctant to leave their home because of a fear of falling?  No      Home Environment   Living Environment Private residence    Living Arrangements Alone    Type of [place] Access Level entry    [place] One level    Additional Comments senior Living - [place]      Prior Function   Level of [place]  Overall Cognitive Status Within Functional Limits for tasks assessed      Observation/Other Assessments   Focus on Therapeutic Outcomes (FOTO)  FOTO intake 42% predicted 61%      Observation/Other Assessments-Edema    Edema Figure 8      Figure 8 Edema   Figure 8 - Right  51.5   ankle   Figure 8 - Left  56.0      Functional Tests   Functional tests Sit to Stand;Single leg stance      Single Leg Stance   Comments unable to stand greater than 3 sec on R and L      Sit to Stand   Comments 46.sec  5 X STS      Posture/Postural Control   Posture/Postural Control Postural limitations    Postural Limitations Rounded Shoulders;Forward head;Anterior pelvic tilt    Posture Comments increased abdominal girth      ROM / Strength   AROM / PROM / Strength AROM;Strength      AROM   Overall AROM  Deficits    Right Hip Flexion 70    Left Hip Flexion 65    Right Knee Extension 101    Right Knee Flexion 101   PROM 113   Left Knee Extension 5    Left Knee Flexion 110   PROM 120   Right Ankle Dorsiflexion -3    Right Ankle Plantar Flexion 65    Right Ankle Inversion 29     Right Ankle Eversion 23    Left Ankle Dorsiflexion -4   pain   Left Ankle Plantar Flexion 50    Left Ankle Inversion 20    Left Ankle Eversion 15      Strength   Overall Strength Deficits    Right Hip Flexion 4-/5    Right Hip Extension 4-/5    Right Hip ABduction 4-/5    Left Hip Flexion 3+/5    Left Hip Extension 3+/5    Left Hip ABduction 3+/5    Right Knee Flexion 4-/5    Right Knee Extension 4-/5    Left Knee Flexion 4-/5    Left Knee Extension 3+/5    Right Ankle Dorsiflexion 4-/5    Right Ankle Plantar Flexion 4-/5    Left Ankle Dorsiflexion 3+/5    Left Ankle Plantar Flexion 4-/5      Flexibility   Hamstrings bil hamstring tightness      Palpation   Palpation comment TTP over medial achilles tendon and gastroc bil,      Transfers   Comments Pt uses momentum to rise from chair and needs to use UE support      Ambulation/Gait   Assistive device None    Gait Pattern Decreased stride length;Decreased step length - left;Decreased hip/knee flexion - right;Decreased hip/knee flexion - left;Decreased dorsiflexion - left;Decreased weight shift to left    Ambulation Surface Level    Gait velocity 1.55ft/sec    Stairs Yes    Stairs Assistance 6: Modified independent (Device/Increase time)    Stair Management Technique One rail Right;Step to pattern    Number of Stairs 8    Height of Stairs 6    Gait Comments Pt with no AD                        Objective measurements completed on examination: See above findings.               PT Education -  04/19/21 1309     Education Details POC  FOTO report  explanation of findings and intiial HEP    Person(s) Educated Patient    Methods Explanation;Demonstration;Tactile cues;Verbal cues;Handout    Comprehension Verbalized understanding;Returned demonstration              PT Short Term Goals - 04/19/21 1318       PT SHORT TERM GOAL #1   Title Pt will be independent with initial HEP     Baseline no knowledge    Time 3    Period Weeks    Status New    Target Date 05/10/21      PT SHORT TERM GOAL #2   Title Pt will improve 5 x STS to at least 25 sec or less to show improved LE strength    Baseline eval 5 x STS 46 sec    Time 3    Period Weeks    Status New    Target Date 05/10/21      PT SHORT TERM GOAL #3   Title Pt will demonstrate improved gait on steps by performing step over step technique and UE support    Baseline Pt avoids steps and lives on one level apartment without steps    Time 3    Period Weeks    Status New    Target Date 05/10/21               PT Long Term Goals - 04/19/21 1039       PT LONG TERM GOAL #1   Title Pt will  be independent with advanced HEP    Baseline No knowledge    Time 8    Period Weeks    Status New    Target Date 06/14/21      PT LONG TERM GOAL #2   Title Pt will improve 5 x STS to at least 15 sec to show improved LE strength    Baseline eval 46 sec    Time 8    Period Weeks    Status New    Target Date 06/14/21      PT LONG TERM GOAL #3   Title Pt will be able to don socks and shoes without diffculty    Baseline Pt  has problems   donning socks and shoes in sitting and unable to bring ankle to knee due to weakness    Time 8    Period Weeks    Status New    Target Date 06/14/21      PT LONG TERM GOAL #4   Title  FOTO will improve from 42%     to  61%    indicating improved functional mobility.    Baseline eval 42% intake    Time 8    Period Weeks    Status New    Target Date 06/14/21      PT LONG TERM GOAL #5   Title Pt will be able to walk a mile and return to gym exercises at [facility]    Baseline unable to walk/ stand greater than 10 minutes    Time 8    Period Weeks    Status New    Target Date 06/14/21      PT LONG TERM GOAL #6   Title Pt will be able to lunge to work on floor to mat transfers in order to work on fall preparedness    Baseline unable to lunge to floor  Time 8     Period Weeks    Status New    Target Date 06/14/21      PT LONG TERM GOAL #7   Title Pt will reduced left LE pain from 7/10 to 2/10 or less in order to perform daily exercises/ walking program    Baseline unable to do dailyexercises routine at [employer].  7/10 pain    Time 8    Period Weeks    Status New    Target Date 06/14/21              Access Code: [email or web address]: [email or web address]: 07/07/2022Prepared by: Donnetta Simpers BeardsleyExercises  Sit to Stand with Counter Support - 3 x daily - 7 x weekly - 1 sets - 10 reps  Gastroc Stretch on Wall - 2 x daily - 7 x weekly - 1 sets - 3 reps - 30 hold  Standing Heel Raise with Support - 1 x daily - 7 x weekly - 3 sets - 10 reps  Seated Long Arc Quad - 1 x daily - 7 x weekly - 3 sets - 10 reps      Plan - 04/19/21 1053     Clinical Impression Statement 75 yo female enters clinic with 1.40 ft/sec and no AD presents with left gastroc/achilles pain for last 3 months. Pt with 5 x STS at 46 sec and showing LE weakness bil with inability to don socks and shoes due to muscle weakness.  Pt states she had been exercising and [facility] 3 months ago but has been decreasing in ability over last 3 months.  Danielle Sexton lives alone and she would benefit from skilled PT 2 x a week for 8 weeks to address LE ankle/gastroc pain in left and bil LE weakness/unsteadiness on feet. in order to prevent falls indicative of 5 x STS score.  Pt does not report any falls at this time and is well motivated to do strengthening.    Personal Factors and Comorbidities Age;Comorbidity 1    Comorbidities nothing remarkable, diverticulitis, HTN    Examination-Activity Limitations Carry;Locomotion Level;Squat;Stairs;Stand;Toileting;Transfers    Examination-Participation Restrictions Cleaning;Laundry;Meal Prep    Stability/Clinical Decision Making Evolving/Moderate complexity    Clinical Decision Making Moderate    Rehab Potential Good    PT Frequency  2x / week    PT Duration 8 weeks    PT Treatment/Interventions ADLs/Self Care Home Management;Aquatic Therapy;Cryotherapy;Electrical Stimulation;Iontophoresis 4mg /ml Dexamethasone;Moist Heat;Ultrasound;Balance training;Therapeutic exercise;Therapeutic activities;Functional mobility training;Stair training;Gait training;Neuromuscular re-education;Patient/family education;Passive range of motion;Manual techniques;Dry needling;Taping;Joint Manipulations    PT Next Visit Plan manual, progress/ review HEP  LE exercise    PT Home Exercise Plan [email or web address]    Consulted and Agree with Plan of Care Patient             Patient will benefit from skilled therapeutic intervention in order to improve the following deficits and impairments:  Difficulty walking, Decreased activity tolerance, Decreased mobility, Decreased range of motion, Decreased strength, Increased muscle spasms, Impaired flexibility, Postural dysfunction, Improper body mechanics, Pain  Visit Diagnosis: Pain in left ankle and joints of left foot  Difficulty in walking, not elsewhere classified  Muscle weakness (generalized)  Cramp and spasm  Unsteadiness on feet     Problem List There are no problems to display for this patient.  Voncille Lo, PT, [place] Certified Exercise Expert for the Aging Adult  04/19/21 1:43 PM Phone: [phone] Fax: [place] [facility] [address] [place], [place], [address] Phone: [phone]  Fax:  [phone]  Name: Danielle Sexton MRN: [id number] Date of Birth: May 12, 1946  Referring diagnosis? M76.62 (ICD-10-CM) - Achilles tendinitis of left lower extremity Treatment diagnosis? (if different than referring diagnosis) Pain in left ankle and joints of foot  What was this (referring dx) caused by? []  Surgery []  Fall []  Ongoing issue []  Arthritis [x]  Other: _Achilles tendonitis  pain in left ankle and joints of foot. Left_  and muscle weakness,  decreased functional mobility________  Laterality: []  Rt []  Lt [x]  Both  Check all possible CPT codes:      []  97110 (Therapeutic Exercise)  []  92507 (SLP Treatment)  []  97112 (Neuro Re-ed)   []  92526 (Swallowing Treatment)   []  97116 (Gait Training)   []  [id number] (Cognitive Training, 1st 15 minutes) []  97140 (Manual Therapy)   []  97130 (Cognitive Training, each add'l 15 minutes)  []  97530 (Therapeutic Activities)  []  Other, List CPT Code ____________    []  31121 (Self Care)       [x]  All codes above (97110 - 97535)  []  97012 (Mechanical Traction)  [x]  97014 (E-stim Unattended)  []  97032 (E-stim manual)  [x]  97033 (Ionto)  []  97035 (Ultrasound)  []  97760 (Orthotic Fit) [x]  [id number] (Physical Performance Training) [x]  [id number] (Aquatic Therapy) []  [id number] (Contrast Bath) []  [id number] (Paraffin) []  97597 (Wound Care 1st 20 sq cm) []  97598 (Wound Care each add'l 20 sq cm) []  97016 (Vasopneumatic Device) []  [id number] Comptroller) []  [id number] (Prosthetic Training)

## 2021-04-19 NOTE — Patient Instructions (Signed)
  Voncille Lo, PT, [place] Certified Exercise Expert for the Aging Adult  04/19/21 10:54 AM Phone: [phone] Fax: [phone]

## 2021-04-24 ENCOUNTER — Other Ambulatory Visit: Payer: Self-pay

## 2021-04-24 ENCOUNTER — Encounter: Payer: Self-pay | Admitting: Physical Therapy

## 2021-04-24 ENCOUNTER — Ambulatory Visit: Payer: Medicare HMO | Admitting: Physical Therapy

## 2021-04-24 DIAGNOSIS — M25572 Pain in left ankle and joints of left foot: Secondary | ICD-10-CM

## 2021-04-24 DIAGNOSIS — R252 Cramp and spasm: Secondary | ICD-10-CM

## 2021-04-24 DIAGNOSIS — R262 Difficulty in walking, not elsewhere classified: Secondary | ICD-10-CM | POA: Diagnosis not present

## 2021-04-24 DIAGNOSIS — M6281 Muscle weakness (generalized): Secondary | ICD-10-CM

## 2021-04-24 DIAGNOSIS — R2681 Unsteadiness on feet: Secondary | ICD-10-CM | POA: Diagnosis not present

## 2021-04-24 NOTE — Therapy (Signed)
[place], [place], [address] Phone: [phone]   Fax:  [phone]  Physical Therapy Treatment  Patient Details  Name: Danielle Sexton MRN: [id number] Date of Birth: 07-10-1946 Referring Provider (PT): Newton Pigg R.DPM   Encounter Date: 04/24/2021   PT End of Session - 04/24/21 0942     Visit Number 2    Number of Visits 16    Date for PT Re-Evaluation 06/14/21    Authorization Type [employer] MCR    PT Start Time 0933    PT Stop Time 1015    PT Time Calculation (min) 42 min    Activity Tolerance Patient tolerated treatment well    Behavior During Therapy [facility] for tasks assessed/performed             History reviewed. No pertinent past medical history.  Past Surgical History:  Procedure Laterality Date   BREAST EXCISIONAL BIOPSY Right    benign    There were no vitals filed for this visit.   Subjective Assessment - 04/24/21 0936     Subjective Intermittent sharp pain in heel with walking and then not as sharp with walking average 5/10. Both knees are 7-8/10 with walking.    Currently in Pain? Yes    Pain Score 5     Pain Location Ankle    Pain Orientation Left    Pain Descriptors / Indicators Aching;Sharp    Pain Type Chronic pain    Aggravating Factors  standing and walking    Pain Relieving Factors sitting                               [facility] Adult PT Treatment/Exercise - 04/24/21 0001       Knee/Hip Exercises: Seated   Long Arc Quad 20 reps;2 sets    [employer] Limitations with and without red band    Hamstring Curl 10 reps;2 sets    Hamstring Limitations red band    Sit to [employer] 5 reps   4 sets, using 4# weight for weight shift, no UE     Manual Therapy   Manual therapy comments cross friction to achilles tendon, passive DF, STW gastroc all in supine      Ankle Exercises: Stretches   Gastroc Stretch 3 reps;30 seconds    Gastroc Stretch  Limitations runners stretch                      PT Short Term Goals - 04/19/21 1318       PT SHORT TERM GOAL #1   Title Pt will be independent with initial HEP    Baseline no knowledge    Time 3    Period Weeks    Status New    Target Date 05/10/21      PT SHORT TERM GOAL #2   Title Pt will improve 5 x STS to at least 25 sec or less to show improved LE strength    Baseline eval 5 x STS 46 sec    Time 3    Period Weeks    Status New    Target Date 05/10/21      PT SHORT TERM GOAL #3   Title Pt will demonstrate improved gait on steps by performing step over step technique and UE support    Baseline Pt avoids steps and lives on one level apartment without steps  Time 3    Period Weeks    Status New    Target Date 05/10/21               PT Long Term Goals - 04/19/21 1039       PT LONG TERM GOAL #1   Title Pt will  be independent with advanced HEP    Baseline No knowledge    Time 8    Period Weeks    Status New    Target Date 06/14/21      PT LONG TERM GOAL #2   Title Pt will improve 5 x STS to at least 15 sec to show improved LE strength    Baseline eval 46 sec    Time 8    Period Weeks    Status New    Target Date 06/14/21      PT LONG TERM GOAL #3   Title Pt will be able to don socks and shoes without diffculty    Baseline Pt  has problems   donning socks and shoes in sitting and unable to bring ankle to knee due to weakness    Time 8    Period Weeks    Status New    Target Date 06/14/21      PT LONG TERM GOAL #4   Title  FOTO will improve from 42%     to  61%    indicating improved functional mobility.    Baseline eval 42% intake    Time 8    Period Weeks    Status New    Target Date 06/14/21      PT LONG TERM GOAL #5   Title Pt will be able to walk a mile and return to gym exercises at [facility].    Baseline unable to walk/ stand greater than 10 minutes    Time 8    Period Weeks    Status New    Target Date 06/14/21       PT LONG TERM GOAL #6   Title Pt will be able to lunge to work on floor to mat transfers in order to work on fall preparedness    Baseline unable to lunge to floor    Time 8    Period Weeks    Status New    Target Date 06/14/21      PT LONG TERM GOAL #7   Title Pt will reduced left LE pain from 7/10 to 2/10 or less in order to perform daily exercises/ walking program    Baseline unable to do dailyexercises routine at [employer].  7/10 pain    Time 8    Period Weeks    Status New    Target Date 06/14/21                   Plan - 04/24/21 0944     Clinical Impression Statement Pt arrives reporting compliance with HEP and that she thinks the HEP is helping. Reviewed HEP with mod cues on correct technique including hold times for gastroc stretching. Added resistance band to LAQs. Reviewed sit-stands and she was able to perform without UE and needed cues for weight shift/ hip hinge. Used 4# weight to assist in proper weight shift. Pt felt limited by her right hip flexibility. Began cross friction massage to achilled Tendon, passive DF ROM and STW to Left gastroc. Pt reported no  increased pain at end of session. She was educated on heel strike with  gait as she left clinic.    PT Next Visit Plan manual, progress/ review HEP  LE exercise, gait, hip flexion ROM    PT Home Exercise Plan [email or web address]             Patient will benefit from skilled therapeutic intervention in order to improve the following deficits and impairments:  Difficulty walking, Decreased activity tolerance, Decreased mobility, Decreased range of motion, Decreased strength, Increased muscle spasms, Impaired flexibility, Postural dysfunction, Improper body mechanics, Pain  Visit Diagnosis: Pain in left ankle and joints of left foot  Difficulty in walking, not elsewhere classified  Cramp and spasm  Unsteadiness on feet  Muscle weakness (generalized)     Problem List There are no problems to display for this  patient.   Dorene Ar, [place] 04/24/2021, 10:47 AM  [place], [place], [address] Phone: [phone]   Fax:  [phone]  Name: Danielle Sexton MRN: [id number] Date of Birth: 04-Mar-1946

## 2021-04-27 ENCOUNTER — Other Ambulatory Visit: Payer: Self-pay

## 2021-04-27 ENCOUNTER — Ambulatory Visit: Payer: Medicare HMO

## 2021-04-27 DIAGNOSIS — R2681 Unsteadiness on feet: Secondary | ICD-10-CM | POA: Diagnosis not present

## 2021-04-27 DIAGNOSIS — M6281 Muscle weakness (generalized): Secondary | ICD-10-CM | POA: Diagnosis not present

## 2021-04-27 DIAGNOSIS — R262 Difficulty in walking, not elsewhere classified: Secondary | ICD-10-CM | POA: Diagnosis not present

## 2021-04-27 DIAGNOSIS — R252 Cramp and spasm: Secondary | ICD-10-CM | POA: Diagnosis not present

## 2021-04-27 DIAGNOSIS — M25572 Pain in left ankle and joints of left foot: Secondary | ICD-10-CM | POA: Diagnosis not present

## 2021-04-27 NOTE — Therapy (Addendum)
[place], [place], [address] Phone: [phone]   Fax:  [phone]  Physical Therapy Treatment  Patient Details  Name: Danielle Sexton MRN: [id number] Date of Birth: 07-Mar-1946 Referring Provider (PT): Newton Pigg R.DPM   Encounter Date: 04/27/2021  PT End of Session - 04/27/21 1402      Visit Number 3    Number of Visits 16    Date for PT Re-Evaluation 06/14/21    Authorization Type [employer] MCR    PT Start Time 1017    PT Stop Time 1059    PT Time Calculation (min) 42 min    Activity Tolerance Patient tolerated treatment well    Behavior During Therapy [facility] for tasks assessed/performed       History reviewed. No pertinent past medical history.  Past Surgical History:  Procedure Laterality Date   BREAST EXCISIONAL BIOPSY Right    benign    There were no vitals filed for this visit.   Subjective Assessment - 04/27/21 1025     Subjective Pt reports the knees and L heel are feeling better    Patient Stated Goals I want to be able to return to exercise at [facility].  I want to be able to rise from my chair better.  I want to walk without pain    Currently in Pain? Yes    Pain Score 5     Pain Location Ankle    Pain Orientation Left    Pain Descriptors / Indicators Aching;Sharp    Pain Type Chronic pain    Pain Onset More than a month ago                               [facility] Adult PT Treatment/Exercise - 04/27/21 0001       Exercises   Exercises Knee/Hip;Ankle      Knee/Hip Exercises: Seated   Long Arc Quad 20 reps;2 sets    Hamstring Curl 10 reps;2 sets    Hamstring Limitations red band    Sit to Sand 10 reps   2 sets     Knee/Hip Exercises: Supine   Straight Leg Raises Left;2 sets;10 reps    Straight Leg Raises Limitations quad set prior to SLR      Knee/Hip Exercises: Sidelying   Hip ABduction Left;2 sets;10 reps    Clams L; 2 sets; 10x       Manual Therapy   Manual therapy comments cross friction to achilles tendon, passive DF, STW gastroc all in supine      Ankle Exercises: Stretches   Gastroc Stretch 3 reps;30 seconds   L and R   Gastroc Stretch Limitations runners stretch                    PT Education - 04/27/21 1346     Education Details Education with pt practicing cross friction massage along the medila and lateral ascepts of the L achilles tendon.    Person(s) Educated Patient    Methods Explanation;Demonstration;Tactile cues;Verbal cues;Handout    Comprehension Verbalized understanding;Returned demonstration;Verbal cues required;Tactile cues required              PT Short Term Goals - 04/19/21 1318       PT SHORT TERM GOAL #1   Title Pt will be independent with initial HEP    Baseline no knowledge    Time 3  Period Weeks    Status New    Target Date 05/10/21      PT SHORT TERM GOAL #2   Title Pt will improve 5 x STS to at least 25 sec or less to show improved LE strength    Baseline eval 5 x STS 46 sec    Time 3    Period Weeks    Status New    Target Date 05/10/21      PT SHORT TERM GOAL #3   Title Pt will demonstrate improved gait on steps by performing step over step technique and UE support    Baseline Pt avoids steps and lives on one level apartment without steps    Time 3    Period Weeks    Status New    Target Date 05/10/21               PT Long Term Goals - 04/19/21 1039       PT LONG TERM GOAL #1   Title Pt will  be independent with advanced HEP    Baseline No knowledge    Time 8    Period Weeks    Status New    Target Date 06/14/21      PT LONG TERM GOAL #2   Title Pt will improve 5 x STS to at least 15 sec to show improved LE strength    Baseline eval 46 sec    Time 8    Period Weeks    Status New    Target Date 06/14/21      PT LONG TERM GOAL #3   Title Pt will be able to don socks and shoes without diffculty    Baseline Pt  has problems    donning socks and shoes in sitting and unable to bring ankle to knee due to weakness    Time 8    Period Weeks    Status New    Target Date 06/14/21      PT LONG TERM GOAL #4   Title  FOTO will improve from 42%     to  61%    indicating improved functional mobility.    Baseline eval 42% intake    Time 8    Period Weeks    Status New    Target Date 06/14/21      PT LONG TERM GOAL #5   Title Pt will be able to walk a mile and return to gym exercises at [facility].    Baseline unable to walk/ stand greater than 10 minutes    Time 8    Period Weeks    Status New    Target Date 06/14/21      PT LONG TERM GOAL #6   Title Pt will be able to lunge to work on floor to mat transfers in order to work on fall preparedness    Baseline unable to lunge to floor    Time 8    Period Weeks    Status New    Target Date 06/14/21      PT LONG TERM GOAL #7   Title Pt will reduced left LE pain from 7/10 to 2/10 or less in order to perform daily exercises/ walking program    Baseline unable to do dailyexercises routine at [employer].  7/10 pain    Time 8    Period Weeks    Status New    Target Date 06/14/21  Plan - 04/27/21 1341     Clinical Impression Statement PT continued cross friction massage to the l achilles and STW to the L calf with instrcution to the pt to complete CFM at home and pt returning demonstration. Additionally, gastroc stretching and LE strengthening were completed with SLR and and hip abd and clams added. Pt's subjective report indicates improvement in pain.    Personal Factors and Comorbidities Age;Comorbidity 1    Comorbidities nothing remarkable, diverticulitis, HTN    Examination-Activity Limitations Carry;Locomotion Level;Squat;Stairs;Stand;Toileting;Transfers    Examination-Participation Restrictions Cleaning;Laundry;Meal Prep    Stability/Clinical Decision Making Evolving/Moderate complexity    Clinical Decision Making Moderate    Rehab  Potential Good    PT Frequency 2x / week    PT Duration 8 weeks    PT Treatment/Interventions ADLs/Self Care Home Management;Aquatic Therapy;Cryotherapy;Electrical Stimulation;Iontophoresis 4mg /ml Dexamethasone;Moist Heat;Ultrasound;Balance training;Therapeutic exercise;Therapeutic activities;Functional mobility training;Stair training;Gait training;Neuromuscular re-education;Patient/family education;Passive range of motion;Manual techniques;Dry needling;Taping;Joint Manipulations    PT Next Visit Plan manual, progress/ review HEP  LE exercise, gait, hip flexion ROM    PT Home Exercise Plan [id number]    Consulted and Agree with Plan of Care Patient             Patient will benefit from skilled therapeutic intervention in order to improve the following deficits and impairments:  Difficulty walking, Decreased activity tolerance, Decreased mobility, Decreased range of motion, Decreased strength, Increased muscle spasms, Impaired flexibility, Postural dysfunction, Improper body mechanics, Pain  Visit Diagnosis: Pain in left ankle and joints of left foot  Difficulty in walking, not elsewhere classified  Cramp and spasm  Unsteadiness on feet  Muscle weakness (generalized)     Problem List There are no problems to display for this patient.   Gar Ponto MS, PT 04/27/21 1:59 PM   [facility] [address] [place], [place], [address] Phone: [phone]   Fax:  [phone]  Name: Danielle Sexton MRN: [id number] Date of Birth: Feb 02, 1946

## 2021-04-30 ENCOUNTER — Ambulatory Visit: Payer: Medicare HMO | Admitting: Physical Therapy

## 2021-04-30 ENCOUNTER — Encounter: Payer: Self-pay | Admitting: Physical Therapy

## 2021-04-30 ENCOUNTER — Other Ambulatory Visit: Payer: Self-pay

## 2021-04-30 DIAGNOSIS — R262 Difficulty in walking, not elsewhere classified: Secondary | ICD-10-CM

## 2021-04-30 DIAGNOSIS — M25572 Pain in left ankle and joints of left foot: Secondary | ICD-10-CM | POA: Diagnosis not present

## 2021-04-30 DIAGNOSIS — R2681 Unsteadiness on feet: Secondary | ICD-10-CM

## 2021-04-30 DIAGNOSIS — M6281 Muscle weakness (generalized): Secondary | ICD-10-CM | POA: Diagnosis not present

## 2021-04-30 DIAGNOSIS — R252 Cramp and spasm: Secondary | ICD-10-CM

## 2021-04-30 NOTE — Therapy (Signed)
[place], [place], [address] Phone: [phone]   Fax:  [phone]  Physical Therapy Treatment  Patient Details  Name: Danielle Sexton MRN: [id number] Date of Birth: 06-30-1946 Referring Provider (PT): Newton Pigg R.DPM   Encounter Date: 04/30/2021   PT End of Session - 04/30/21 1022     Visit Number 4    Number of Visits 16    Date for PT Re-Evaluation 06/14/21    Authorization Type [employer] MCR    PT Start Time 1749    PT Stop Time 1100    PT Time Calculation (min) 45 min             History reviewed. No pertinent past medical history.  Past Surgical History:  Procedure Laterality Date   BREAST EXCISIONAL BIOPSY Right    benign    There were no vitals filed for this visit.   Subjective Assessment - 04/30/21 1020     Subjective Not bad pain right now. I can feel twinges in my right hip and knees.    Currently in Pain? Yes    Pain Score 5     Pain Orientation Left    Pain Descriptors / Indicators Aching    Pain Type Chronic pain    Pain Frequency Intermittent    Aggravating Factors  standing and walking    Pain Relieving Factors sitting                [facility] PT Assessment - 04/30/21 0001       Transfers   Five time sit to stand comments  15.8 sec hands on knees                           [facility] Adult PT Treatment/Exercise - 04/30/21 0001       Knee/Hip Exercises: Seated   Sit to Sand 5 reps   4 sets     Knee/Hip Exercises: Supine   Straight Leg Raises 5 reps    Straight Leg Raises Limitations 2 sets each   needs UE to assist RLE     Knee/Hip Exercises: Sidelying   Hip ABduction Left;10 reps    Hip ABduction Limitations 2 sets    Clams x 15 each side      Manual Therapy   Manual therapy comments cross friction to achilles tendon, passive DF, STW gastroc all in supine      Ankle Exercises: Aerobic   Nustep L4 LE only x 5 min      Ankle  Exercises: Stretches   Gastroc Stretch 3 reps;30 seconds   L and R   Gastroc Stretch Limitations runners stretch                      PT Short Term Goals - 04/30/21 1043       PT SHORT TERM GOAL #1   Title Pt will be independent with initial HEP    Time 3    Period Weeks    Status Achieved    Target Date 05/10/21      PT SHORT TERM GOAL #2   Baseline eval 5 x STS 46 sec,     15.8 sec on 04/30/21    Time 3    Period Weeks    Status Achieved    Target Date 05/10/21      PT SHORT TERM GOAL #3   Title Pt will  demonstrate improved gait on steps by performing step over step technique and UE support    Baseline can perform atlernating pattern with bilat UE in clinic    Time 3    Period Weeks    Status Achieved    Target Date 05/10/21               PT Long Term Goals - 04/19/21 1039       PT LONG TERM GOAL #1   Title Pt will  be independent with advanced HEP    Baseline No knowledge    Time 8    Period Weeks    Status New    Target Date 06/14/21      PT LONG TERM GOAL #2   Title Pt will improve 5 x STS to at least 15 sec to show improved LE strength    Baseline eval 46 sec    Time 8    Period Weeks    Status New    Target Date 06/14/21      PT LONG TERM GOAL #3   Title Pt will be able to don socks and shoes without diffculty    Baseline Pt  has problems   donning socks and shoes in sitting and unable to bring ankle to knee due to weakness    Time 8    Period Weeks    Status New    Target Date 06/14/21      PT LONG TERM GOAL #4   Title  FOTO will improve from 42%     to  61%    indicating improved functional mobility.    Baseline eval 42% intake    Time 8    Period Weeks    Status New    Target Date 06/14/21      PT LONG TERM GOAL #5   Title Pt will be able to walk a mile and return to gym exercises at [facility].    Baseline unable to walk/ stand greater than 10 minutes    Time 8    Period Weeks    Status New    Target Date 06/14/21       PT LONG TERM GOAL #6   Title Pt will be able to lunge to work on floor to mat transfers in order to work on fall preparedness    Baseline unable to lunge to floor    Time 8    Period Weeks    Status New    Target Date 06/14/21      PT LONG TERM GOAL #7   Title Pt will reduced left LE pain from 7/10 to 2/10 or less in order to perform daily exercises/ walking program    Baseline unable to do dailyexercises routine at [employer].  7/10 pain    Time 8    Period Weeks    Status New    Target Date 06/14/21                   Plan - 04/30/21 1108     Clinical Impression Statement Pt reports achilles pain has become more intermittent. She reports "twinges" in right hip and knees with activity. Her 5 X STS has improved to 15.8 sec. She can climb 6 inch stairs reciprocally with bilat UE support and reports no increased pain. She is independent with her current HEP. All STGs met. Continued with closed chain LE strength and begain ankle theraband strengthening. With Right SLR she needs UE  assist. At end of session she reported feeling good.    PT Next Visit Plan manual, progress/ review HEP  LE exercise, gait, hip flexion ROM, step ups ., STS, consider partial lunge    PT Home Exercise Plan [id number]             Patient will benefit from skilled therapeutic intervention in order to improve the following deficits and impairments:  Difficulty walking, Decreased activity tolerance, Decreased mobility, Decreased range of motion, Decreased strength, Increased muscle spasms, Impaired flexibility, Postural dysfunction, Improper body mechanics, Pain  Visit Diagnosis: Pain in left ankle and joints of left foot  Cramp and spasm  Difficulty in walking, not elsewhere classified  Unsteadiness on feet  Muscle weakness (generalized)     Problem List There are no problems to display for this patient.   Dorene Ar, [place] 04/30/2021, [place], [place], [address] Phone: [phone]   Fax:  [phone]  Name: Danielle Sexton MRN: [id number] Date of Birth: 1946/02/02

## 2021-05-08 ENCOUNTER — Other Ambulatory Visit: Payer: Self-pay

## 2021-05-08 ENCOUNTER — Ambulatory Visit: Payer: Medicare HMO

## 2021-05-08 DIAGNOSIS — R262 Difficulty in walking, not elsewhere classified: Secondary | ICD-10-CM

## 2021-05-08 DIAGNOSIS — M6281 Muscle weakness (generalized): Secondary | ICD-10-CM | POA: Diagnosis not present

## 2021-05-08 DIAGNOSIS — M25572 Pain in left ankle and joints of left foot: Secondary | ICD-10-CM | POA: Diagnosis not present

## 2021-05-08 DIAGNOSIS — R2681 Unsteadiness on feet: Secondary | ICD-10-CM | POA: Diagnosis not present

## 2021-05-08 DIAGNOSIS — R252 Cramp and spasm: Secondary | ICD-10-CM

## 2021-05-08 NOTE — Therapy (Signed)
[place], [place], [address] Phone: [phone]   Fax:  [phone]  Physical Therapy Treatment  Patient Details  Name: Danielle Sexton MRN: [id number] Date of Birth: 1945-11-03 Referring Provider (PT): Newton Pigg R.DPM   Encounter Date: 05/08/2021   PT End of Session - 05/08/21 0935     Visit Number 5    Number of Visits 16    Date for PT Re-Evaluation 06/14/21    Authorization Type [employer] MCR    PT Start Time 0850    PT Stop Time 0935    PT Time Calculation (min) 45 min    Activity Tolerance Patient tolerated treatment well    Behavior During Therapy [facility] for tasks assessed/performed             History reviewed. No pertinent past medical history.  Past Surgical History:  Procedure Laterality Date   BREAST EXCISIONAL BIOPSY Right    benign    There were no vitals filed for this visit.   Subjective Assessment - 05/08/21 0858     Subjective The L heel bothered me some this weekend. Overall, the L heel pain is better.    Patient Stated Goals I want to be able to return to exercise at [facility].  I want to be able to rise from my chair better.  I want to walk without pain    Pain Score 6     Pain Orientation Left    Pain Descriptors / Indicators Aching    Pain Type Chronic pain    Pain Onset More than a month ago    Pain Frequency Intermittent                               [facility] Adult PT Treatment/Exercise - 05/08/21 0001       Exercises   Exercises Knee/Hip;Ankle      Knee/Hip Exercises: Seated   Sit to Sand 10 reps   2 sets     Knee/Hip Exercises: Supine   Straight Leg Raises Right;Left;2 sets;10 reps      Knee/Hip Exercises: Sidelying   Hip ABduction Right;Left;2 sets;10 reps    Clams L and R; 2 sets; 10x      Manual Therapy   Manual therapy comments cross friction to achilles tendon and peronal tendons, passive DF, STW gastroc all in supine       Ankle Exercises: Stretches   Gastroc Stretch 2 reps   L and R   Gastroc Stretch Limitations runners stretch      Ankle Exercises: Aerobic   Nustep L6 UE/LE x 5 min                      PT Short Term Goals - 04/30/21 1043       PT SHORT TERM GOAL #1   Title Pt will be independent with initial HEP    Time 3    Period Weeks    Status Achieved    Target Date 05/10/21      PT SHORT TERM GOAL #2   Baseline eval 5 x STS 46 sec,     15.8 sec on 04/30/21    Time 3    Period Weeks    Status Achieved    Target Date 05/10/21      PT SHORT TERM GOAL #3   Title Pt will demonstrate improved gait on  steps by performing step over step technique and UE support    Baseline can perform atlernating pattern with bilat UE in clinic    Time 3    Period Weeks    Status Achieved    Target Date 05/10/21               PT Long Term Goals - 04/19/21 1039       PT LONG TERM GOAL #1   Title Pt will  be independent with advanced HEP    Baseline No knowledge    Time 8    Period Weeks    Status New    Target Date 06/14/21      PT LONG TERM GOAL #2   Title Pt will improve 5 x STS to at least 15 sec to show improved LE strength    Baseline eval 46 sec    Time 8    Period Weeks    Status New    Target Date 06/14/21      PT LONG TERM GOAL #3   Title Pt will be able to don socks and shoes without diffculty    Baseline Pt  has problems   donning socks and shoes in sitting and unable to bring ankle to knee due to weakness    Time 8    Period Weeks    Status New    Target Date 06/14/21      PT LONG TERM GOAL #4   Title  FOTO will improve from 42%     to  61%    indicating improved functional mobility.    Baseline eval 42% intake    Time 8    Period Weeks    Status New    Target Date 06/14/21      PT LONG TERM GOAL #5   Title Pt will be able to walk a mile and return to gym exercises at [facility].    Baseline unable to walk/ stand greater than 10 minutes    Time  8    Period Weeks    Status New    Target Date 06/14/21      PT LONG TERM GOAL #6   Title Pt will be able to lunge to work on floor to mat transfers in order to work on fall preparedness    Baseline unable to lunge to floor    Time 8    Period Weeks    Status New    Target Date 06/14/21      PT LONG TERM GOAL #7   Title Pt will reduced left LE pain from 7/10 to 2/10 or less in order to perform daily exercises/ walking program    Baseline unable to do dailyexercises routine at [facility].  7/10 pain    Time 8    Period Weeks    Status New    Target Date 06/14/21                   Plan - 05/08/21 1258     Clinical Impression Statement PT was completed for STM to the L calf and cross friction massage to the L achilles and peroneal tendons. Additionally, strengthening exs were completed for stabiliy and balance. Pt remarked her legs feel stronger with exs getting easier. Pt was able to complete STS 10x for 2 sets vs. 5x for 4 sets with more frequent rest needed. At end of session, pt reported she was not experiencing L achilles/heel pain.  Personal Factors and Comorbidities Age;Comorbidity 1    Comorbidities nothing remarkable, diverticulitis, HTN    Examination-Participation Restrictions Cleaning;Laundry;Meal Prep    Stability/Clinical Decision Making Evolving/Moderate complexity    Clinical Decision Making Moderate    Rehab Potential Good    PT Frequency 2x / week    PT Duration 8 weeks    PT Treatment/Interventions ADLs/Self Care Home Management;Aquatic Therapy;Cryotherapy;Electrical Stimulation;Iontophoresis '4mg'$ /ml Dexamethasone;Moist Heat;Ultrasound;Balance training;Therapeutic exercise;Therapeutic activities;Functional mobility training;Stair training;Gait training;Neuromuscular re-education;Patient/family education;Passive range of motion;Manual techniques;Dry needling;Taping;Joint Manipulations    PT Next Visit Plan Re-assess FOTO and STGs. manual, progress/ review HEP   LE exercise, gait, hip flexion ROM, step ups ., STS, consider partial lunge    PT Home Exercise Plan [id number]    Consulted and Agree with Plan of Care Patient             Patient will benefit from skilled therapeutic intervention in order to improve the following deficits and impairments:  Difficulty walking, Decreased activity tolerance, Decreased mobility, Decreased range of motion, Decreased strength, Increased muscle spasms, Impaired flexibility, Postural dysfunction, Improper body mechanics, Pain  Visit Diagnosis: Pain in left ankle and joints of left foot  Cramp and spasm  Unsteadiness on feet  Muscle weakness (generalized)  Difficulty in walking, not elsewhere classified     Problem List There are no problems to display for this patient.   Gar Ponto MS, PT 05/08/21 1:15 PM   [facility] [address] [place], [place], [address] Phone: [phone]   Fax:  [phone]  Name: KAMIE CREASY MRN: [id number] Date of Birth: 1946/10/12

## 2021-05-11 ENCOUNTER — Ambulatory Visit: Payer: Medicare HMO

## 2021-05-11 ENCOUNTER — Other Ambulatory Visit: Payer: Self-pay

## 2021-05-11 DIAGNOSIS — R252 Cramp and spasm: Secondary | ICD-10-CM

## 2021-05-11 DIAGNOSIS — M25572 Pain in left ankle and joints of left foot: Secondary | ICD-10-CM

## 2021-05-11 DIAGNOSIS — R2681 Unsteadiness on feet: Secondary | ICD-10-CM | POA: Diagnosis not present

## 2021-05-11 DIAGNOSIS — M6281 Muscle weakness (generalized): Secondary | ICD-10-CM

## 2021-05-11 DIAGNOSIS — R262 Difficulty in walking, not elsewhere classified: Secondary | ICD-10-CM

## 2021-05-11 NOTE — Therapy (Signed)
[address], [place], [address] Phone: [phone]   Fax:  [phone]  Physical Therapy Treatment  Patient Details  Name: Danielle Sexton MRN: [id number] Date of Birth: 11-Jan-1946 Referring Provider (PT): Newton Pigg R.DPM   Encounter Date: 05/11/2021   PT End of Session - 05/11/21 1012     Visit Number 6    Number of Visits 16    Date for PT Re-Evaluation 06/14/21    Authorization Type [employer] MCR    PT Start Time [id number]    PT Stop Time 1100    PT Time Calculation (min) 46 min    Activity Tolerance Patient tolerated treatment well    Behavior During Therapy [facility] for tasks assessed/performed             History reviewed. No pertinent past medical history.  Past Surgical History:  Procedure Laterality Date   BREAST EXCISIONAL BIOPSY Right    benign    There were no vitals filed for this visit.   Subjective Assessment - 05/11/21 1017     Subjective Pt reports her L heel bothers her some from time to time, but she is able to stay active.    Pertinent History nothing remarkable, diverticulitis, HTN    Limitations Standing;Walking;House hold activities    Patient Stated Goals I want to be able to return to exercise at [facility].  I want to be able to rise from my chair better.  I want to walk without pain    Currently in Pain? No/denies    Pain Location Heel    Pain Orientation Left    Pain Descriptors / Indicators Aching    Pain Type Chronic pain    Pain Onset More than a month ago    Pain Frequency Intermittent                               [facility] Adult PT Treatment/Exercise - 05/11/21 0001       Exercises   Exercises Knee/Hip;Ankle      Knee/Hip Exercises: Standing   Knee Flexion Right;Left;2 sets;10 reps    Knee Flexion Limitations 2#    Hip Abduction Right;Left;2 sets;10 reps    Abduction Limitations 2#    Lateral Step Up Right;Left;2 sets;10 reps;Step  Height: 2"    Lateral Step Up Limitations airex    Other Standing Knee Exercises Tandem balance on airex 18mn x 2 with each foot forward      Knee/Hip Exercises: Seated   Long Arc Quad Right;Left;15 reps   2 sets   Sit to [employer]10 reps   2 sets     Ankle Exercises: Stretches   Soleus Stretch 2 reps;20 seconds    Gastroc Stretch 2 reps   L and R   Gastroc Stretch Limitations runners stretch      Ankle Exercises: Aerobic   Nustep L6 UE/LE x 5 min                      PT Short Term Goals - 04/30/21 1043       PT SHORT TERM GOAL #1   Title Pt will be independent with initial HEP    Time 3    Period Weeks    Status Achieved    Target Date 05/10/21      PT SHORT TERM GOAL #2   Baseline eval 5 x STS  46 sec,     15.8 sec on 04/30/21    Time 3    Period Weeks    Status Achieved    Target Date 05/10/21      PT SHORT TERM GOAL #3   Title Pt will demonstrate improved gait on steps by performing step over step technique and UE support    Baseline can perform atlernating pattern with bilat UE in clinic    Time 3    Period Weeks    Status Achieved    Target Date 05/10/21               PT Long Term Goals - 05/11/21 1136       PT LONG TERM GOAL #4   Title  FOTO will improve from 42%     to  61%    indicating improved functional mobility.05/11/21-49%    Baseline eval 42% intake    Status On-going                   Plan - 05/11/21 1010     Clinical Impression Statement Re-assessed FOTO with pt's score being consistent with her report of improving. Score improved from 42 to 49%. Today PT progressed to the completion of more standing-CKC and balance exs. With tandem standing on the airex, pt reported not feeling as steady with the L foot in the back position in comparison to the R. Pt will continue to benefit from skilled PT to address L heel pain, increase strength and balance to optimize pt's functional mobility.    Personal Factors and Comorbidities  Age;Comorbidity 1    Comorbidities nothing remarkable, diverticulitis, HTN    Examination-Activity Limitations Carry;Locomotion Level;Squat;Stairs;Stand;Toileting;Transfers    Examination-Participation Restrictions Cleaning;Laundry;Meal Prep    Stability/Clinical Decision Making Evolving/Moderate complexity    Clinical Decision Making Moderate    Rehab Potential Good    PT Frequency 2x / week    PT Duration 8 weeks    PT Treatment/Interventions ADLs/Self Care Home Management;Aquatic Therapy;Cryotherapy;Electrical Stimulation;Iontophoresis '4mg'$ /ml Dexamethasone;Moist Heat;Ultrasound;Balance training;Therapeutic exercise;Therapeutic activities;Functional mobility training;Stair training;Gait training;Neuromuscular re-education;Patient/family education;Passive range of motion;Manual techniques;Dry needling;Taping;Joint Manipulations    PT Next Visit Plan Re-assess FOTO and STGs. manual, progress/ review HEP  LE exercise, gait, hip flexion ROM, step ups ., STS, consider partial lunge    PT Home Exercise Plan [id number]    Consulted and Agree with Plan of Care Patient             Patient will benefit from skilled therapeutic intervention in order to improve the following deficits and impairments:  Difficulty walking, Decreased activity tolerance, Decreased mobility, Decreased range of motion, Decreased strength, Increased muscle spasms, Impaired flexibility, Postural dysfunction, Improper body mechanics, Pain  Visit Diagnosis: Pain in left ankle and joints of left foot  Cramp and spasm  Unsteadiness on feet  Muscle weakness (generalized)  Difficulty in walking, not elsewhere classified     Problem List There are no problems to display for this patient.  Gar Ponto MS, PT 05/11/21 11:47 AM   [facility] [address] [place], [place], [address] Phone: [phone]   Fax:  [phone]  Name: Danielle Sexton MRN:  [id number] Date of Birth: 07/03/1946

## 2021-05-14 ENCOUNTER — Encounter: Payer: Self-pay | Admitting: Physical Therapy

## 2021-05-14 ENCOUNTER — Other Ambulatory Visit: Payer: Self-pay

## 2021-05-14 ENCOUNTER — Ambulatory Visit: Payer: Medicare HMO | Attending: Podiatry | Admitting: Physical Therapy

## 2021-05-14 DIAGNOSIS — R262 Difficulty in walking, not elsewhere classified: Secondary | ICD-10-CM | POA: Diagnosis not present

## 2021-05-14 DIAGNOSIS — M6281 Muscle weakness (generalized): Secondary | ICD-10-CM

## 2021-05-14 DIAGNOSIS — R2681 Unsteadiness on feet: Secondary | ICD-10-CM

## 2021-05-14 DIAGNOSIS — R252 Cramp and spasm: Secondary | ICD-10-CM | POA: Diagnosis not present

## 2021-05-14 DIAGNOSIS — M25572 Pain in left ankle and joints of left foot: Secondary | ICD-10-CM

## 2021-05-14 NOTE — Therapy (Signed)
[place], [place], [address] Phone: [phone]   Fax:  [phone]  Physical Therapy Treatment  Patient Details  Name: Danielle Sexton MRN: [id number] Date of Birth: April 25, 1946 Referring Provider (PT): Newton Pigg R.DPM   Encounter Date: 05/14/2021   PT End of Session - 05/14/21 1103     Visit Number 7    Number of Visits 16    Date for PT Re-Evaluation 06/14/21    Authorization Type [employer] MCR    PT Start Time 1100    PT Stop Time 1145    PT Time Calculation (min) 45 min             History reviewed. No pertinent past medical history.  Past Surgical History:  Procedure Laterality Date   BREAST EXCISIONAL BIOPSY Right    benign    There were no vitals filed for this visit.   Subjective Assessment - 05/14/21 1104     Subjective Pt reports no pain on arrival. Heel pain is present in the morning or with alot of walking.    Currently in Pain? No/denies                               [facility] Adult PT Treatment/Exercise - 05/14/21 0001       Transfers   Five time sit to stand comments  12.1 arms crossed      Knee/Hip Exercises: Standing   Other Standing Knee Exercises Tandem balance on airex 11mn x 2 with each foot forward    Other Standing Knee Exercises side stepping at counter red band at feet 4 passes      Knee/Hip Exercises: Seated   Sit to Sand 10 reps   2 sets     Knee/Hip Exercises: Supine   Short Arc Quad Sets 20 reps    Bridges 10 reps    Straight Leg Raises 10 reps;1 set;Left;Right    Straight Leg Raises Limitations quad lag on right    Other Supine Knee/Hip Exercises Supine bent knee raise alternating 10 x 2      Knee/Hip Exercises: Sidelying   Hip ABduction Right;Left;2 sets;10 reps    Clams L and R; 2 sets; 10x      Ankle Exercises: Aerobic   Nustep L5 UE/LE x 6 min                      PT Short Term Goals - 04/30/21 1043        PT SHORT TERM GOAL #1   Title Pt will be independent with initial HEP    Time 3    Period Weeks    Status Achieved    Target Date 05/10/21      PT SHORT TERM GOAL #2   Baseline eval 5 x STS 46 sec,     15.8 sec on 04/30/21    Time 3    Period Weeks    Status Achieved    Target Date 05/10/21      PT SHORT TERM GOAL #3   Title Pt will demonstrate improved gait on steps by performing step over step technique and UE support    Baseline can perform atlernating pattern with bilat UE in clinic    Time 3    Period Weeks    Status Achieved    Target Date 05/10/21  PT Long Term Goals - 05/14/21 1151       PT LONG TERM GOAL #1   Title Pt will  be independent with advanced HEP    Time 8    Period Weeks    Status On-going      PT LONG TERM GOAL #2   Title Pt will improve 5 x STS to at least 15 sec to show improved LE strength    Baseline 12.1    Time 8    Period Weeks    Status Achieved      PT LONG TERM GOAL #3   Title Pt will be able to don socks and shoes without diffculty    Time 8    Period Weeks    Status Unable to assess      PT LONG TERM GOAL #4   Title  FOTO will improve from 42%     to  61%    indicating improved functional mobility.    Baseline eval 42% intake;05/11/21-49%    Time 8    Period Weeks    Status On-going      PT LONG TERM GOAL #5   Title Pt will be able to walk a mile and return to gym exercises at [facility].    Baseline unable to walk/ stand greater than 10 minutes; 05/14/21: can walk 15-20 minutes    Time 8    Period Weeks    Status On-going      PT LONG TERM GOAL #6   Title Pt will be able to lunge to work on floor to mat transfers in order to work on fall preparedness    Baseline unable to lunge to floor    Time 8    Period Weeks    Status On-going      PT LONG TERM GOAL #7   Title Pt will reduced left LE pain from 7/10 to 2/10 or less in order to perform daily exercises/ walking program    Baseline unable to  do dailyexercises routine at [facility].  7/10 pain ;    Time 8    Period Weeks    Status Unable to assess                   Plan - 05/14/21 1106     Clinical Impression Statement Pt reports heel pain with proonged walking and intermittently in the morning. SHe reports the exercises and self massage help reduce her pain. She is walking 15-20 minutes at a time and some days has more pain in the heel. Her 5 x STS has improved to <15 seconds. LTG# 2 met. Pt tolerated session well without increased pain.    PT Next Visit Plan manual, progress/ review HEP  LE exercise, gait, hip flexion ROM, step ups ., STS, consider partial lunge    PT Home Exercise Plan [id number]             Patient will benefit from skilled therapeutic intervention in order to improve the following deficits and impairments:  Difficulty walking, Decreased activity tolerance, Decreased mobility, Decreased range of motion, Decreased strength, Increased muscle spasms, Impaired flexibility, Postural dysfunction, Improper body mechanics, Pain  Visit Diagnosis: No diagnosis found.     Problem List There are no problems to display for this patient.   Dorene Ar, [place] 05/14/2021, 1:37 PM  [facility] [address] [place], [place], [address] Phone: [phone]   Fax:  [phone]  Name:  Danielle Sexton MRN: [id number] Date of Birth: [birth year]

## 2021-05-15 ENCOUNTER — Ambulatory Visit: Payer: Medicare HMO | Admitting: Podiatry

## 2021-05-15 DIAGNOSIS — M7662 Achilles tendinitis, left leg: Secondary | ICD-10-CM | POA: Diagnosis not present

## 2021-05-16 NOTE — Progress Notes (Signed)
  Subjective:  Patient ID: Danielle Sexton, female    DOB: 09-29-1946,  MRN: [id number]  Chief Complaint  Patient presents with   Tendonitis    re-check Achilles tendon    75 y.o. female returns with the above complaint. History confirmed with patient.  Physical therapy has been very helpful she feels like it is about 50% better  Objective:  Physical Exam: warm, good capillary refill, no trophic changes or ulcerative lesions, normal DP and PT pulses, and normal sensory exam.  Bilaterally she has onychomycosis with thickened elongated brown-yellow discoloration of the toenails, 6 nails affected Left Foot: Mild edema around the left ankle, she has minimal pain on palpation of the insertion of Achilles tendon none on the plantar fascia   Radiographs: Multiple views x-ray of both feet: no fracture, dislocation, swelling or degenerative changes noted Assessment:   1. Achilles tendinitis of left lower extremity      Plan:  Patient was evaluated and treated and all questions answered.   Discussed the etiology and treatment options for Achilles tendinitis including stretching, formal physical therapy with an eccentric exercises therapy plan, supportive shoegears such as a running shoe or sneaker, heel lifts, topical and oral medications.  We also discussed that I do not routinely perform injections in this area because of the risk of an increased damage or rupture of the tendon.  We also discussed the role of surgical treatment of this for patients who do not improve after exhausting non-surgical treatment options.  Continues to improve with physical therapy recommend we continue this for now she will follow-up in 2 months   No follow-ups on file.

## 2021-05-17 ENCOUNTER — Other Ambulatory Visit: Payer: Self-pay

## 2021-05-17 ENCOUNTER — Encounter: Payer: Self-pay | Admitting: Physical Therapy

## 2021-05-17 ENCOUNTER — Ambulatory Visit: Payer: Medicare HMO | Admitting: Physical Therapy

## 2021-05-17 DIAGNOSIS — R2681 Unsteadiness on feet: Secondary | ICD-10-CM | POA: Diagnosis not present

## 2021-05-17 DIAGNOSIS — M6281 Muscle weakness (generalized): Secondary | ICD-10-CM | POA: Diagnosis not present

## 2021-05-17 DIAGNOSIS — R252 Cramp and spasm: Secondary | ICD-10-CM

## 2021-05-17 DIAGNOSIS — M25572 Pain in left ankle and joints of left foot: Secondary | ICD-10-CM

## 2021-05-17 DIAGNOSIS — R262 Difficulty in walking, not elsewhere classified: Secondary | ICD-10-CM | POA: Diagnosis not present

## 2021-05-17 NOTE — Therapy (Signed)
[place], [place], [address] Phone: [phone]   Fax:  [phone]  Physical Therapy Treatment  Patient Details  Name: Danielle Sexton MRN: [id number] Date of Birth: [birth year] Referring Provider (PT): Newton Pigg R.DPM   Encounter Date: 05/17/2021   PT End of Session - 05/17/21 1018     Visit Number 8    Number of Visits 16    Date for PT Re-Evaluation 06/14/21    Authorization Type [employer] MCR    PT Start Time 1015    PT Stop Time 1100    PT Time Calculation (min) 45 min             History reviewed. No pertinent past medical history.  Past Surgical History:  Procedure Laterality Date   BREAST EXCISIONAL BIOPSY Right    benign    There were no vitals filed for this visit.   Subjective Assessment - 05/17/21 1017     Subjective I know how to deal with my heel pain. Today the pain is in my right hip and thigh. Saw MD on Tuesday who recommended I do whatever PT recommends.    Currently in Pain? Yes    Pain Score 6     Pain Location Hip    Pain Orientation Right    Pain Descriptors / Indicators Aching    Pain Type Chronic pain    Pain Radiating Towards thigh    Aggravating Factors  weather, cold air conditioning    Pain Relieving Factors sitting                [facility] PT Assessment - 05/17/21 0001       AROM   Right Ankle Dorsiflexion -3   PROM 0   Left Ankle Dorsiflexion 0   +2                          [facility] Adult PT Treatment/Exercise - 05/17/21 0001       Knee/Hip Exercises: Supine   Short Arc Quad Sets 20 reps    Bridges 20 reps    Straight Leg Raises 10 reps;1 set;Left;Right    Straight Leg Raises Limitations quad lag on right    Other Supine Knee/Hip Exercises Supine bent knee raise alternating 10 x 2      Manual Therapy   Manual therapy comments passive DF bilateral      Ankle Exercises: Aerobic   Nustep L5 UE/LE x 6 min      Ankle  Exercises: Supine   T-Band Red band DF x 20      Ankle Exercises: Stretches   Soleus Stretch 60 seconds;1 rep    Gastroc Stretch 60 seconds;1 rep   L and R   Gastroc Stretch Limitations runners stretch    Slant Board Stretch 60 seconds                      PT Short Term Goals - 04/30/21 1043       PT SHORT TERM GOAL #1   Title Pt will be independent with initial HEP    Time 3    Period Weeks    Status Achieved    Target Date 05/10/21      PT SHORT TERM GOAL #2   Baseline eval 5 x STS 46 sec,     15.8 sec on 04/30/21    Time 3    Period  Weeks    Status Achieved    Target Date 05/10/21      PT SHORT TERM GOAL #3   Title Pt will demonstrate improved gait on steps by performing step over step technique and UE support    Baseline can perform atlernating pattern with bilat UE in clinic    Time 3    Period Weeks    Status Achieved    Target Date 05/10/21               PT Long Term Goals - 05/17/21 1036       PT LONG TERM GOAL #1   Title Pt will  be independent with advanced HEP    Time 8    Period Weeks    Status On-going      PT LONG TERM GOAL #2   Title Pt will improve 5 x STS to at least 15 sec to show improved LE strength    Baseline 12.1    Time 8    Period Weeks    Status Achieved      PT LONG TERM GOAL #3   Title Pt will be able to don socks and shoes without diffculty    Baseline no difficulty    Time 8    Period Weeks    Status Achieved      PT LONG TERM GOAL #4   Title  FOTO will improve from 42%     to  61%    indicating improved functional mobility.    Baseline eval 42% intake;05/11/21-49%    Time 8    Period Weeks    Status On-going      PT LONG TERM GOAL #5   Title Pt will be able to walk a mile and return to gym exercises at [facility].    Baseline unable to walk/ stand greater than 10 minutes; 05/14/21: can walk 15-20 minutes    Time 8    Period Weeks    Status On-going      PT LONG TERM GOAL #6   Title Pt will be  able to lunge to work on floor to mat transfers in order to work on fall preparedness    Baseline unable to lunge to floor    Time 8    Period Weeks    Status On-going      PT LONG TERM GOAL #7   Title Pt will reduced left LE pain from 7/10 to 2/10 or less in order to perform daily exercises/ walking program    Baseline pt reports pain is now 3-4/10 at most in left heel    Time 8    Period Weeks    Status On-going                   Plan - 05/17/21 1019     Clinical Impression Statement Pt reports heel pain is still present intermittently in the mornings and with prolonged walking. She reports she can manage her pain with her HEP and the pain will go away. Pain can still be 3-4/10 with prolonged walking greater than 15-20 minutes. She is also limited by right hip pain and demonstrates decreased strength. Her Ankle DF AROM has improved on the left. Continued with DF AROM and PROM. Pt would like to decrease frequency to 1 x per week to continue toward remaining LTGS.    PT Next Visit Plan manual, progress/ review HEP  LE exercise, gait, hip flexion ROM, step ups .,  STS, consider partial lunge    PT Home Exercise Plan [id number]             Patient will benefit from skilled therapeutic intervention in order to improve the following deficits and impairments:  Difficulty walking, Decreased activity tolerance, Decreased mobility, Decreased range of motion, Decreased strength, Increased muscle spasms, Impaired flexibility, Postural dysfunction, Improper body mechanics, Pain  Visit Diagnosis: Pain in left ankle and joints of left foot  Cramp and spasm  Unsteadiness on feet  Muscle weakness (generalized)  Difficulty in walking, not elsewhere classified     Problem List There are no problems to display for this patient.   Dorene Ar, [place] 05/17/2021, 11:21 AM  [facility] [address] [place], [place],  [address] Phone: [phone]   Fax:  [phone]  Name: Danielle Sexton MRN: [id number] Date of Birth: 1946-06-27

## 2021-05-21 ENCOUNTER — Encounter: Payer: Medicare HMO | Admitting: Physical Therapy

## 2021-05-24 ENCOUNTER — Other Ambulatory Visit: Payer: Self-pay

## 2021-05-24 ENCOUNTER — Encounter: Payer: Self-pay | Admitting: Physical Therapy

## 2021-05-24 ENCOUNTER — Ambulatory Visit: Payer: Medicare HMO | Admitting: Physical Therapy

## 2021-05-24 DIAGNOSIS — R2681 Unsteadiness on feet: Secondary | ICD-10-CM | POA: Diagnosis not present

## 2021-05-24 DIAGNOSIS — M6281 Muscle weakness (generalized): Secondary | ICD-10-CM

## 2021-05-24 DIAGNOSIS — R252 Cramp and spasm: Secondary | ICD-10-CM | POA: Diagnosis not present

## 2021-05-24 DIAGNOSIS — M25572 Pain in left ankle and joints of left foot: Secondary | ICD-10-CM | POA: Diagnosis not present

## 2021-05-24 DIAGNOSIS — R262 Difficulty in walking, not elsewhere classified: Secondary | ICD-10-CM | POA: Diagnosis not present

## 2021-05-24 NOTE — Therapy (Signed)
[place], [place], [address] Phone: [phone]   Fax:  [phone]  Physical Therapy Treatment  Patient Details  Name: Danielle Sexton MRN: [id number] Date of Birth: Sep 12, 1946 Referring Provider (PT): Newton Pigg R.DPM   Encounter Date: 05/24/2021   PT End of Session - 05/24/21 1021     Visit Number 9    Number of Visits 16    Date for PT Re-Evaluation 06/14/21    Authorization Type [employer] MCR    Authorization Time Period 04/19/21-06/16/21 , 16 visits    Authorization - Visit Number 9    Authorization - Number of Visits 16    Progress Note Due on Visit 10    PT Start Time [id number]    PT Stop Time 1100    PT Time Calculation (min) 45 min    Activity Tolerance Patient tolerated treatment well    Behavior During Therapy [facility] for tasks assessed/performed             History reviewed. No pertinent past medical history.  Past Surgical History:  Procedure Laterality Date   BREAST EXCISIONAL BIOPSY Right    benign    There were no vitals filed for this visit.   Subjective Assessment - 05/24/21 1020     Subjective Early this morning I had some heel pain in bed but afet I stretch it out it goes away. No hip pain today. I am using my right leg more to step up and can lift my leg better when I am dressing.    Currently in Pain? No/denies                               [facility] Adult PT Treatment/Exercise - 05/24/21 0001       Knee/Hip Exercises: Standing   Heel Raises 10 reps;2 sets    Knee Flexion 15 reps;Right;Left    Forward Step Up 10 reps;Step Height: 6"    Other Standing Knee Exercises partial lunge with 1 UE on counter x 10 each Leg    Other Standing Knee Exercises side stepping at counter green band at feet 4 passes      Knee/Hip Exercises: Seated   Sit to Sand 10 reps   2 sets     Knee/Hip Exercises: Supine   Bridges 20 reps    Straight Leg Raises 10 reps;1  set;Left;Right      Ankle Exercises: Aerobic   Nustep L5 UE/LE x 6 min      Ankle Exercises: Stretches   Soleus Stretch 60 seconds;1 rep    Gastroc Stretch 60 seconds;1 rep   L and R   Gastroc Stretch Limitations runners stretch    Slant Board Stretch 60 seconds                      PT Short Term Goals - 04/30/21 1043       PT SHORT TERM GOAL #1   Title Pt will be independent with initial HEP    Time 3    Period Weeks    Status Achieved    Target Date 05/10/21      PT SHORT TERM GOAL #2   Baseline eval 5 x STS 46 sec,     15.8 sec on 04/30/21    Time 3    Period Weeks    Status Achieved    Target Date 05/10/21  PT SHORT TERM GOAL #3   Title Pt will demonstrate improved gait on steps by performing step over step technique and UE support    Baseline can perform atlernating pattern with bilat UE in clinic    Time 3    Period Weeks    Status Achieved    Target Date 05/10/21               PT Long Term Goals - 05/17/21 1036       PT LONG TERM GOAL #1   Title Pt will  be independent with advanced HEP    Time 8    Period Weeks    Status On-going      PT LONG TERM GOAL #2   Title Pt will improve 5 x STS to at least 15 sec to show improved LE strength    Baseline 12.1    Time 8    Period Weeks    Status Achieved      PT LONG TERM GOAL #3   Title Pt will be able to don socks and shoes without diffculty    Baseline no difficulty    Time 8    Period Weeks    Status Achieved      PT LONG TERM GOAL #4   Title  FOTO will improve from 42%     to  61%    indicating improved functional mobility.    Baseline eval 42% intake;05/11/21-49%    Time 8    Period Weeks    Status On-going      PT LONG TERM GOAL #5   Title Pt will be able to walk a mile and return to gym exercises at [facility].    Baseline unable to walk/ stand greater than 10 minutes; 05/14/21: can walk 15-20 minutes    Time 8    Period Weeks    Status On-going      PT LONG TERM  GOAL #6   Title Pt will be able to lunge to work on floor to mat transfers in order to work on fall preparedness    Baseline unable to lunge to floor    Time 8    Period Weeks    Status On-going      PT LONG TERM GOAL #7   Title Pt will reduced left LE pain from 7/10 to 2/10 or less in order to perform daily exercises/ walking program    Baseline pt reports pain is now 3-4/10 at most in left heel    Time 8    Period Weeks    Status On-going                   Plan - 05/24/21 1024     Clinical Impression Statement Pt arrives reporting decreased right hip and thigh pain. Able to begin partial lunge to work toward floor transfer goal.             Patient will benefit from skilled therapeutic intervention in order to improve the following deficits and impairments:     Visit Diagnosis: Pain in left ankle and joints of left foot  Cramp and spasm  Unsteadiness on feet  Muscle weakness (generalized)  Difficulty in walking, not elsewhere classified     Problem List There are no problems to display for this patient.   Dorene Ar, [place] 05/24/2021, 12:13 PM  [place], [place], [address] Phone: [phone]  Fax:  [phone]  Name: ANNITRA REEG MRN: [id number] Date of Birth: 1946-08-06

## 2021-05-28 ENCOUNTER — Encounter: Payer: Medicare HMO | Admitting: Physical Therapy

## 2021-05-31 ENCOUNTER — Other Ambulatory Visit: Payer: Self-pay

## 2021-05-31 ENCOUNTER — Encounter: Payer: Self-pay | Admitting: Physical Therapy

## 2021-05-31 ENCOUNTER — Ambulatory Visit: Payer: Medicare HMO | Admitting: Physical Therapy

## 2021-05-31 DIAGNOSIS — M25572 Pain in left ankle and joints of left foot: Secondary | ICD-10-CM

## 2021-05-31 DIAGNOSIS — R2681 Unsteadiness on feet: Secondary | ICD-10-CM | POA: Diagnosis not present

## 2021-05-31 DIAGNOSIS — R252 Cramp and spasm: Secondary | ICD-10-CM

## 2021-05-31 DIAGNOSIS — R262 Difficulty in walking, not elsewhere classified: Secondary | ICD-10-CM | POA: Diagnosis not present

## 2021-05-31 DIAGNOSIS — M6281 Muscle weakness (generalized): Secondary | ICD-10-CM

## 2021-05-31 NOTE — Therapy (Addendum)
[place], [place], [address] Phone: [phone]   Fax:  [phone]  Physical Therapy Treatment/Progress Note   Progress Note Reporting Period 04-19-21 to 05-31-21  See note below for Objective Data and Assessment of Progress/Goals.   Pt has been making steady progress toward completion of goals. Improved 5 x STS from 46 sec to 12.1 sec and  able to do stand to floor transfer with UE support.   Pt is working as a Recruitment consultant for [employer] and is trying to incorporate more walking movement into day. Will probably reevaluate  next visit and DC     Patient Details  Name: Danielle Sexton MRN: [id number] Date of Birth: 1946-02-16 Referring Provider (PT): Newton Pigg R.DPM   Encounter Date: 05/31/2021   PT End of Session - 05/31/21 1022     Visit Number 10    Number of Visits 16    Date for PT Re-Evaluation 06/14/21    Authorization Type [employer] MCR    Authorization Time Period 04/19/21-06/16/21 , 16 visits    Authorization - Visit Number 10    Authorization - Number of Visits 16    Progress Note Due on Visit 10    PT Start Time 1017    PT Stop Time 1100    PT Time Calculation (min) 43 min             History reviewed. No pertinent past medical history.  Past Surgical History:  Procedure Laterality Date   BREAST EXCISIONAL BIOPSY Right    benign    There were no vitals filed for this visit.   Subjective Assessment - 05/31/21 1021     Subjective No pain right now. I had a little heel pain during the night. I massaged it and did my exercises and it gets better.    Currently in Pain? No/denies                               [facility] Adult PT Treatment/Exercise - 05/31/21 0001       Therapeutic Activites    Therapeutic Activities Other Therapeutic Activities    Other Therapeutic Activities Kneel down to pad on floor with UE assist on mat then return to standing  using ue assist on mat.      Knee/Hip Exercises: Standing   Heel Raises 10 reps;2 sets    Heel Raises Limitations toe raises x 10 x 2    Forward Step Up 10 reps;Step Height: 6"    Forward Step Up Limitations 2 sets each    Other Standing Knee Exercises partial lunge with 1 UE on counter x 10 each Leg    Other Standing Knee Exercises --      Knee/Hip Exercises: Seated   Sit to Sand 10 reps   2 sets     Knee/Hip Exercises: Supine   Bridges 20 reps    Straight Leg Raises 10 reps;1 set;Left;Right    Straight Leg Raises Limitations improved ability to maintain right knee extension with SLR      Knee/Hip Exercises: Sidelying   Hip ABduction Right;Left;2 sets;10 reps      Ankle Exercises: Stretches   Soleus Stretch 60 seconds;1 rep   on slant board   Gastroc Stretch Limitations runners stretch    Slant Board Stretch 60 seconds  PT Short Term Goals - 04/30/21 1043       PT SHORT TERM GOAL #1   Title Pt will be independent with initial HEP    Time 3    Period Weeks    Status Achieved    Target Date 05/10/21      PT SHORT TERM GOAL #2   Baseline eval 5 x STS 46 sec,     15.8 sec on 04/30/21    Time 3    Period Weeks    Status Achieved    Target Date 05/10/21      PT SHORT TERM GOAL #3   Title Pt will demonstrate improved gait on steps by performing step over step technique and UE support    Baseline can perform atlernating pattern with bilat UE in clinic    Time 3    Period Weeks    Status Achieved    Target Date 05/10/21               PT Long Term Goals - 05/31/21 1046       PT LONG TERM GOAL #1   Title Pt will  be independent with advanced HEP    Baseline independent with HEP thus far    Time 8    Period Weeks    Status On-going      PT LONG TERM GOAL #2   Title Pt will improve 5 x STS to at least 15 sec to show improved LE strength    Baseline 12.1    Time 8    Period Weeks    Status Achieved      PT LONG TERM GOAL  #3   Title Pt will be able to don socks and shoes without diffculty    Baseline no difficulty    Time 8    Period Weeks    Status Achieved      PT LONG TERM GOAL #4   Title  FOTO will improve from 42%     to  61%    indicating improved functional mobility.    Baseline eval 42% intake;05/11/21-49%    Time 8    Status On-going      PT LONG TERM GOAL #5   Title Pt will be able to walk a mile and return to gym exercises at [facility].    Baseline unable to walk/ stand greater than 10 minutes; 05/31/21: can walk at least 20 minutes    Time 8    Period Weeks    Status Partially Met      PT LONG TERM GOAL #6   Title Pt will be able to lunge to work on floor to mat transfers in order to work on fall preparedness    Baseline performing partial lunges and can kneel on floor and get back up with UE assist on mat table    Time 8    Period Weeks    Status On-going                   Plan - 05/31/21 1022     Clinical Impression Statement Pt reports intermittent pain in thigh and heel that seems attributed to the weather. She is independent with pain management strategies for her heel including self massage and stretching. Continued with partial lunges and instructed pt in kneel to mat on floor with return to standing using UE on mat table x 1. She is progressing toward her remainning LTGs. She has one more visit  in [place] and will likely be ready for discharge at that time. She has met most of her LTGS and is working on increasing her walking time.    PT Next Visit Plan review, FOTO, discharge, needs this visit to have progress note addded (10th visit)    PT Home Exercise Plan [id number]    Consulted and Agree with Plan of Care Patient             Patient will benefit from skilled therapeutic intervention in order to improve the following deficits and impairments:  Difficulty walking, Decreased activity tolerance, Decreased mobility, Decreased range of motion, Decreased strength,  Increased muscle spasms, Impaired flexibility, Postural dysfunction, Improper body mechanics, Pain  Visit Diagnosis: Pain in left ankle and joints of left foot  Unsteadiness on feet  Muscle weakness (generalized)  Difficulty in walking, not elsewhere classified  Cramp and spasm     Problem List There are no problems to display for this patient.   Dorene Ar, [place] 05/31/2021, 11:22 AM  [facility] [address] [place], [place], [address] Phone: [phone]   Fax:  [phone]  Name: Danielle Sexton MRN: [id number] Date of Birth: Jun 11, 1946   Progress note  Voncille Lo, PT, [facility] Certified Exercise Expert for the Aging Adult  06/07/21 12:23 PM Phone: [phone] Fax: [phone]

## 2021-06-04 ENCOUNTER — Encounter: Payer: Medicare HMO | Admitting: Physical Therapy

## 2021-06-07 ENCOUNTER — Other Ambulatory Visit: Payer: Self-pay

## 2021-06-07 ENCOUNTER — Ambulatory Visit: Payer: Medicare HMO | Admitting: Physical Therapy

## 2021-06-07 DIAGNOSIS — M1710 Unilateral primary osteoarthritis, unspecified knee: Secondary | ICD-10-CM | POA: Diagnosis not present

## 2021-06-07 DIAGNOSIS — M25572 Pain in left ankle and joints of left foot: Secondary | ICD-10-CM

## 2021-06-07 DIAGNOSIS — M1611 Unilateral primary osteoarthritis, right hip: Secondary | ICD-10-CM | POA: Diagnosis not present

## 2021-06-07 DIAGNOSIS — M6281 Muscle weakness (generalized): Secondary | ICD-10-CM

## 2021-06-07 DIAGNOSIS — R262 Difficulty in walking, not elsewhere classified: Secondary | ICD-10-CM

## 2021-06-07 DIAGNOSIS — E7800 Pure hypercholesterolemia, unspecified: Secondary | ICD-10-CM | POA: Diagnosis not present

## 2021-06-07 DIAGNOSIS — R2681 Unsteadiness on feet: Secondary | ICD-10-CM

## 2021-06-07 DIAGNOSIS — R252 Cramp and spasm: Secondary | ICD-10-CM | POA: Diagnosis not present

## 2021-06-07 DIAGNOSIS — E785 Hyperlipidemia, unspecified: Secondary | ICD-10-CM | POA: Diagnosis not present

## 2021-06-07 DIAGNOSIS — I10 Essential (primary) hypertension: Secondary | ICD-10-CM | POA: Diagnosis not present

## 2021-06-07 NOTE — Therapy (Signed)
[place], [place], [address] Phone: [phone]   Fax:  [phone]  Physical Therapy Treatment/Discharge Note  Patient Details  Name: Danielle Sexton MRN: [id number] Date of Birth: [birth year] Referring Provider (PT): Newton Pigg R.DPM   Encounter Date: 06/07/2021   PT End of Session - 06/07/21 1233     Visit Number 11    Number of Visits 16    Date for PT Re-Evaluation 06/14/21    Authorization Type [employer] MCR    Authorization Time Period 04/19/21-06/16/21 , 16 visits    Authorization - Visit Number 11    Authorization - Number of Visits 16    Progress Note Due on Visit 10    PT Start Time 7915    PT Stop Time 1100    PT Time Calculation (min) 45 min    Activity Tolerance Patient tolerated treatment well    Behavior During Therapy [facility] for tasks assessed/performed             No past medical history on file.  Past Surgical History:  Procedure Laterality Date   BREAST EXCISIONAL BIOPSY Right    benign    There were no vitals filed for this visit.       [facility] PT Assessment - 06/07/21 0001       Assessment   Medical Diagnosis Achilles Tendonitis of L LE    Referring Provider (PT) Newton Pigg R.DPM    Onset Date/Surgical Date 01/17/21    Hand Dominance Right      Observation/Other Assessments   Focus on Therapeutic Outcomes (FOTO)  FOTO intake 84% predicted 61%      Functional Tests   Functional tests Sit to Stand;Single leg stance      Single Leg Stance   Comments right 13.75  left 7.36 sec      Sit to Stand   Comments 11.75.sec  5 X STS      AROM   Overall AROM  Deficits    Right Hip Flexion 80    Left Hip Flexion 80    Right Knee Extension 0    Right Knee Flexion 116   PROM 1119   Left Knee Extension 5    Left Knee Flexion 115   PROM 120   Right Ankle Dorsiflexion 0   PROM 2   Right Ankle Plantar Flexion 65    Right Ankle Inversion 29    Right Ankle Eversion  23    Left Ankle Dorsiflexion 2    Left Ankle Plantar Flexion 60    Left Ankle Inversion 3    Left Ankle Eversion 18      Strength   Right Hip Flexion 4-/5    Right Hip Extension 4-/5    Right Hip ABduction 4-/5    Left Hip Flexion 4-/5    Left Hip Extension 4-/5    Left Hip ABduction 4-/5    Right Knee Flexion 4-/5    Right Knee Extension 4/5    Left Knee Flexion 4-/5    Left Knee Extension 4/5    Right Ankle Dorsiflexion 4-/5    Right Ankle Plantar Flexion 4-/5    Left Ankle Dorsiflexion 3+/5   llimited by hard end feel/stiffness   Left Ankle Plantar Flexion 4-/5                           [facility] Adult PT Treatment/Exercise - 06/07/21 0001  Self-Care   Self-Care Other Self-Care Comments    Other Self-Care Comments  reviewed basic movmement requirements for health.  incorporating movement into day. fall preparedness      Therapeutic Activites    Therapeutic Activities Other Therapeutic Activities    Other Therapeutic Activities Kneel down to pad on floor with UE assist on mat then return to standing using ue assist on mat. 5 x to build strength and enducarance      Knee/Hip Exercises: Standing   Heel Raises 10 reps;2 sets    Heel Raises Limitations toe raises x 10 x 2    Forward Step Up 10 reps;Step Height: 6"    Forward Step Up Limitations 2 sets each using 15 # KB    Other Standing Knee Exercises partial lunge with 1 UE on counter x 10 each Leg      Knee/Hip Exercises: Seated   Sit to Sand 10 reps;1 set   2 set with 15 # KB     Ankle Exercises: Stretches   Other Stretch sitting hamstring stretch 30 sec x 2 R and L                    PT Education - 06/07/21 1232     Education Details ther Activities  floor to stand xfer training and reinforcment.  movement requirements for health and incorporating into daily life.  review of HEP, FOTO and DC    Person(s) Educated Patient    Methods Explanation;Demonstration;Tactile cues;Verbal cues     Comprehension Verbalized understanding;Returned demonstration              PT Short Term Goals - 04/30/21 1043       PT SHORT TERM GOAL #1   Title Pt will be independent with initial HEP    Time 3    Period Weeks    Status Achieved    Target Date 05/10/21      PT SHORT TERM GOAL #2   Baseline eval 5 x STS 46 sec,     15.8 sec on 04/30/21    Time 3    Period Weeks    Status Achieved    Target Date 05/10/21      PT SHORT TERM GOAL #3   Title Pt will demonstrate improved gait on steps by performing step over step technique and UE support    Baseline can perform atlernating pattern with bilat UE in clinic    Time 3    Period Weeks    Status Achieved    Target Date 05/10/21               PT Long Term Goals - 06/07/21 1048       PT LONG TERM GOAL #1   Title Pt will  be independent with advanced HEP    Baseline independent with HEP thus far    Time 8    Period Weeks    Status Achieved      PT LONG TERM GOAL #2   Title Pt will improve 5 x STS to at least 15 sec to show improved LE strength    Baseline 11.75 sec 06-07-21    Time 8    Period Weeks    Status Achieved      PT LONG TERM GOAL #3   Title Pt will be able to don socks and shoes without diffculty    Baseline no difficulty    Time 8    Status Achieved  PT LONG TERM GOAL #4   Title  FOTO will improve from 42%     to  61%    indicating improved functional mobility.    Baseline eval 42% intake;05/11/21-49%  84% 06-07-21    Time 8    Period Weeks    Status Achieved      PT LONG TERM GOAL #5   Title Pt will be able to walk a mile and return to gym exercises at [facility].    Baseline Pt able to walk for at least 20 min, does not measure distance but trying to incorporate more movement in day    Time 8    Period Weeks    Status Partially Met      PT LONG TERM GOAL #6   Title Pt will be able to lunge to work on floor to mat transfers in order to work on fall preparedness    Baseline Pt able  to [employer] chair and squaring with hips and shld to lunge/ 1/2 knee , quadriped down and up from floor independently    Time 8    Period Weeks    Status Achieved                   Plan - 06/07/21 1016     Clinical Impression Statement Pt is ready for DC and demonstrating ability to rise and descend to floor x 5 .  All LTG achieved or partially achieved.  Pt improved 5 X STS 11.75 sec,  FOTO improved to 84 % intake forar surpassing the 64% predicted.   Pt reports no pain on entering clinic and was able to perform step ups with 15 # KB as well as sit to stand. showing improvement iwht LE strength and decrease in risk of falls  Pt will be DC and return to work as a [employer].  She is aware of importance of incorporationg movement into daily life for health.    Personal Factors and Comorbidities Age;Comorbidity 1    Comorbidities nothing remarkable, diverticulitis, HTN    Examination-Activity Limitations Carry;Locomotion Level;Squat;Stairs;Stand;Toileting;Transfers    Examination-Participation Restrictions Cleaning;Laundry;Meal Prep    PT Frequency 2x / week    PT Duration 8 weeks    PT Treatment/Interventions ADLs/Self Care Home Management;Aquatic Therapy;Cryotherapy;Electrical Stimulation;Iontophoresis 53m/ml Dexamethasone;Moist Heat;Ultrasound;Balance training;Therapeutic exercise;Therapeutic activities;Functional mobility training;Stair training;Gait training;Neuromuscular re-education;Patient/family education;Passive range of motion;Manual techniques;Dry needling;Taping;Joint Manipulations    PT Next Visit Plan review, FOTO, discharge, needs this visit to have progress note addded (10th visit)    PT Home Exercise Plan [id number]    Consulted and Agree with Plan of Care Patient             Patient will benefit from skilled therapeutic intervention in order to improve the following deficits and impairments:  Difficulty walking, Decreased activity tolerance, Decreased  mobility, Decreased range of motion, Decreased strength, Increased muscle spasms, Impaired flexibility, Postural dysfunction, Improper body mechanics, Pain  Visit Diagnosis: Pain in left ankle and joints of left foot  Unsteadiness on feet  Muscle weakness (generalized)  Difficulty in walking, not elsewhere classified  Cramp and spasm     Problem List There are no problems to display for this patient.  LVoncille Lo PT, AClaremontCertified Exercise Expert for the Aging Adult  06/07/21 12:41 PM Phone: [phone]: [phone]  [facility] [address]Phone: [phone]  Fax:  [phone] Name: Danielle GRADEMRN:  [id number] Date of Birth: January 14, 1946  PHYSICAL THERAPY DISCHARGE SUMMARY  Visits from Start of Care: 1  Current functional level related to goals / functional outcomes: As above   Remaining deficits: Still needs work with HEP for gastroc stretch and SLS stance which she is aware of working on with [employer] / Equipment: HEP   Patient agrees to discharge. Patient goals were met. Patient is being discharged due to meeting the stated rehab goals.    Pt is pleased with current functional level. All goals met except for measuring a  59mle walk but can walk for 20 minutes.  Able to sit to stand with 15 #  LVoncille Lo PT, ALauriumCertified Exercise Expert for the Aging Adult  06/07/21 12:41 PM Phone: [phone]: [phone]

## 2021-06-11 ENCOUNTER — Encounter: Payer: Medicare HMO | Admitting: Physical Therapy

## 2021-06-13 DIAGNOSIS — I10 Essential (primary) hypertension: Secondary | ICD-10-CM | POA: Diagnosis not present

## 2021-06-14 ENCOUNTER — Encounter: Payer: Medicare HMO | Admitting: Physical Therapy

## 2021-07-03 ENCOUNTER — Other Ambulatory Visit: Payer: Self-pay

## 2021-07-03 ENCOUNTER — Ambulatory Visit: Payer: Medicare HMO | Admitting: Podiatry

## 2021-07-03 ENCOUNTER — Encounter: Payer: Self-pay | Admitting: Podiatry

## 2021-07-03 DIAGNOSIS — B351 Tinea unguium: Secondary | ICD-10-CM | POA: Diagnosis not present

## 2021-07-03 DIAGNOSIS — M79674 Pain in right toe(s): Secondary | ICD-10-CM

## 2021-07-03 DIAGNOSIS — M79675 Pain in left toe(s): Secondary | ICD-10-CM

## 2021-07-03 NOTE — Progress Notes (Signed)
This patient returns to the office for evaluation and treatment of long thick painful nails .  This patient is unable to trim herown nails since the patient cannot reach her feet.  Patient says the nails are painful walking and wearing his shoes.  She returns for preventive foot care services.  General Appearance  Alert, conversant and in no acute stress.  Vascular  Dorsalis pedis and posterior tibial  pulses are palpable  bilaterally.  Capillary return is within normal limits  bilaterally. Temperature is within normal limits  bilaterally.  Neurologic  Senn-Weinstein monofilament wire test within normal limits  bilaterally. Muscle power within normal limits bilaterally.  Nails Thick disfigured discolored nails with subungual debris  from hallux to fifth toes bilaterally. No evidence of bacterial infection or drainage bilaterally.  Orthopedic  No limitations of motion  feet .  No crepitus or effusions noted.  No bony pathology or digital deformities noted.  HAV  B/L.  Skin  normotropic skin with no porokeratosis noted bilaterally.  No signs of infections or ulcers noted.     Onychomycosis  Pain in toes right foot  Pain in toes left foot  Debridement  of nails  1-5  B/L with a nail nipper.  Nails were then filed using a dremel tool with no incidents.    RTC 3 months    Gardiner Barefoot DPM

## 2021-07-04 ENCOUNTER — Ambulatory Visit: Payer: Medicare HMO | Admitting: Podiatry

## 2021-07-17 ENCOUNTER — Ambulatory Visit: Payer: Medicare HMO | Admitting: Podiatry

## 2021-07-23 DIAGNOSIS — I10 Essential (primary) hypertension: Secondary | ICD-10-CM | POA: Diagnosis not present

## 2021-07-23 DIAGNOSIS — M1611 Unilateral primary osteoarthritis, right hip: Secondary | ICD-10-CM | POA: Diagnosis not present

## 2021-07-23 DIAGNOSIS — M1710 Unilateral primary osteoarthritis, unspecified knee: Secondary | ICD-10-CM | POA: Diagnosis not present

## 2021-07-23 DIAGNOSIS — E7800 Pure hypercholesterolemia, unspecified: Secondary | ICD-10-CM | POA: Diagnosis not present

## 2021-07-23 DIAGNOSIS — E785 Hyperlipidemia, unspecified: Secondary | ICD-10-CM | POA: Diagnosis not present

## 2021-07-31 DIAGNOSIS — J309 Allergic rhinitis, unspecified: Secondary | ICD-10-CM | POA: Diagnosis not present

## 2021-07-31 DIAGNOSIS — M1710 Unilateral primary osteoarthritis, unspecified knee: Secondary | ICD-10-CM | POA: Diagnosis not present

## 2021-07-31 DIAGNOSIS — I10 Essential (primary) hypertension: Secondary | ICD-10-CM | POA: Diagnosis not present

## 2021-07-31 DIAGNOSIS — E7800 Pure hypercholesterolemia, unspecified: Secondary | ICD-10-CM | POA: Diagnosis not present

## 2021-09-04 DIAGNOSIS — I10 Essential (primary) hypertension: Secondary | ICD-10-CM | POA: Diagnosis not present

## 2021-09-04 DIAGNOSIS — E7800 Pure hypercholesterolemia, unspecified: Secondary | ICD-10-CM | POA: Diagnosis not present

## 2021-09-04 DIAGNOSIS — M1611 Unilateral primary osteoarthritis, right hip: Secondary | ICD-10-CM | POA: Diagnosis not present

## 2021-09-04 DIAGNOSIS — E785 Hyperlipidemia, unspecified: Secondary | ICD-10-CM | POA: Diagnosis not present

## 2021-09-04 DIAGNOSIS — M1710 Unilateral primary osteoarthritis, unspecified knee: Secondary | ICD-10-CM | POA: Diagnosis not present

## 2021-10-03 ENCOUNTER — Ambulatory Visit: Payer: Medicare HMO | Admitting: Podiatry

## 2021-10-11 DIAGNOSIS — I10 Essential (primary) hypertension: Secondary | ICD-10-CM | POA: Diagnosis not present

## 2021-11-13 DIAGNOSIS — I10 Essential (primary) hypertension: Secondary | ICD-10-CM | POA: Diagnosis not present

## 2021-11-15 DIAGNOSIS — I10 Essential (primary) hypertension: Secondary | ICD-10-CM | POA: Diagnosis not present

## 2021-11-15 DIAGNOSIS — E785 Hyperlipidemia, unspecified: Secondary | ICD-10-CM | POA: Diagnosis not present

## 2021-11-19 ENCOUNTER — Other Ambulatory Visit: Payer: Self-pay

## 2021-11-19 ENCOUNTER — Encounter: Payer: Self-pay | Admitting: Podiatry

## 2021-11-19 ENCOUNTER — Ambulatory Visit: Payer: Medicare HMO | Admitting: Podiatry

## 2021-11-19 DIAGNOSIS — M79672 Pain in left foot: Secondary | ICD-10-CM

## 2021-11-19 DIAGNOSIS — B351 Tinea unguium: Secondary | ICD-10-CM

## 2021-11-19 DIAGNOSIS — M79671 Pain in right foot: Secondary | ICD-10-CM

## 2021-11-19 DIAGNOSIS — M79674 Pain in right toe(s): Secondary | ICD-10-CM

## 2021-11-19 DIAGNOSIS — M79675 Pain in left toe(s): Secondary | ICD-10-CM | POA: Diagnosis not present

## 2021-11-25 ENCOUNTER — Encounter: Payer: Self-pay | Admitting: Podiatry

## 2021-11-25 DIAGNOSIS — M179 Osteoarthritis of knee, unspecified: Secondary | ICD-10-CM

## 2021-11-25 DIAGNOSIS — E785 Hyperlipidemia, unspecified: Secondary | ICD-10-CM

## 2021-11-25 DIAGNOSIS — J309 Allergic rhinitis, unspecified: Secondary | ICD-10-CM

## 2021-11-25 DIAGNOSIS — M169 Osteoarthritis of hip, unspecified: Secondary | ICD-10-CM

## 2021-11-25 DIAGNOSIS — I10 Essential (primary) hypertension: Secondary | ICD-10-CM

## 2021-11-25 DIAGNOSIS — M7662 Achilles tendinitis, left leg: Secondary | ICD-10-CM

## 2021-11-25 HISTORY — DX: Osteoarthritis of knee, unspecified: M17.9

## 2021-11-25 HISTORY — DX: Osteoarthritis of hip, unspecified: M16.9

## 2021-11-25 HISTORY — DX: Allergic rhinitis, unspecified: J30.9

## 2021-11-25 HISTORY — DX: Achilles tendinitis, left leg: M76.62

## 2021-11-25 HISTORY — DX: Hyperlipidemia, unspecified: E78.5

## 2021-11-25 HISTORY — DX: Essential (primary) hypertension: I10

## 2021-11-25 NOTE — Progress Notes (Signed)
°  Subjective:  Patient ID: Danielle Sexton, female    DOB: 23-Nov-1945,  MRN: [id number]  Danielle Sexton presents to clinic today for painful porokeratotic lesion(s) of both feet and painful mycotic toenails that limit ambulation. Painful toenails interfere with ambulation. Aggravating factors include wearing enclosed shoe gear. Pain is relieved with periodic professional debridement. Painful porokeratotic lesions are aggravated when weightbearing with and without shoegear. Pain is relieved with periodic professional debridement.  New problem(s): None.   PCP is Carol Ada, MD , and last visit was July 31, 2021.  Allergies  Allergen Reactions   Latex Other (See Comments) and Swelling    Review of Systems: Negative except as noted in the HPI. Objective:   Constitutional Danielle Sexton is a pleasant 76 y.o. African American female, in NAD. AAO x 3.   Vascular CFT immediate b/l LE. Palpable DP/PT pulses b/l LE. Digital hair sparse b/l. Skin temperature gradient WNL b/l. No pain with calf compression b/l. No edema noted b/l. No cyanosis or clubbing noted b/l LE.  Neurologic Normal speech. Oriented to person, place, and time. Protective sensation intact 5/5 intact bilaterally with 10g monofilament b/l. Vibratory sensation intact b/l.  Dermatologic Pedal integument with normal turgor, texture and tone BLE. No open wounds b/l LE. No interdigital macerations noted b/l LE. Toenails 1-5 b/l elongated, discolored, dystrophic, thickened, crumbly with subungual debris and tenderness to dorsal palpation. Porokeratotic lesion(s) R hallux and submet head 5 left foot. No erythema, no edema, no drainage, no fluctuance.  Orthopedic: Muscle strength 5/5 to all lower extremity muscle groups bilaterally. No pain, crepitus or joint limitation noted with ROM bilateral LE. HAV with bunion deformity noted b/l LE. Hammertoe deformity noted 2-5 b/l. Pes planus deformity noted bilateral LE.   Radiographs:  None  Last A1c: No flowsheet data found.   Assessment:   1. Pain due to onychomycosis of toenails of both feet   2. Pain in both feet    Plan:  Patient was evaluated and treated and all questions answered. Consent given for treatment as described below: -Examined patient. -Patient declines paring of porokeratoses today. Advised use of pumice stone to areas once weekly after bath/shower . -Mycotic toenails 1-5 bilaterally were debrided in length and girth with sterile nail nippers and dremel without incident. -Patient/POA to call should there be question/concern in the interim.  Return in about 3 months (around 02/16/2022).  Marzetta Board, DPM

## 2021-11-28 DIAGNOSIS — M25551 Pain in right hip: Secondary | ICD-10-CM | POA: Diagnosis not present

## 2021-11-28 DIAGNOSIS — M25561 Pain in right knee: Secondary | ICD-10-CM | POA: Diagnosis not present

## 2021-11-28 DIAGNOSIS — M25562 Pain in left knee: Secondary | ICD-10-CM | POA: Diagnosis not present

## 2021-11-30 DIAGNOSIS — M25551 Pain in right hip: Secondary | ICD-10-CM | POA: Insufficient documentation

## 2021-12-05 DIAGNOSIS — M25551 Pain in right hip: Secondary | ICD-10-CM | POA: Diagnosis not present

## 2021-12-14 DIAGNOSIS — D120 Benign neoplasm of cecum: Secondary | ICD-10-CM | POA: Diagnosis not present

## 2021-12-14 DIAGNOSIS — K2930 Chronic superficial gastritis without bleeding: Secondary | ICD-10-CM | POA: Diagnosis not present

## 2021-12-14 DIAGNOSIS — R1310 Dysphagia, unspecified: Secondary | ICD-10-CM | POA: Diagnosis not present

## 2021-12-14 DIAGNOSIS — K6389 Other specified diseases of intestine: Secondary | ICD-10-CM | POA: Diagnosis not present

## 2021-12-14 DIAGNOSIS — K219 Gastro-esophageal reflux disease without esophagitis: Secondary | ICD-10-CM | POA: Diagnosis not present

## 2021-12-14 DIAGNOSIS — K2289 Other specified disease of esophagus: Secondary | ICD-10-CM | POA: Diagnosis not present

## 2021-12-14 DIAGNOSIS — K259 Gastric ulcer, unspecified as acute or chronic, without hemorrhage or perforation: Secondary | ICD-10-CM | POA: Diagnosis not present

## 2021-12-14 DIAGNOSIS — Z86010 Personal history of colonic polyps: Secondary | ICD-10-CM | POA: Diagnosis not present

## 2021-12-14 DIAGNOSIS — K5730 Diverticulosis of large intestine without perforation or abscess without bleeding: Secondary | ICD-10-CM | POA: Diagnosis not present

## 2021-12-14 DIAGNOSIS — Q399 Congenital malformation of esophagus, unspecified: Secondary | ICD-10-CM | POA: Diagnosis not present

## 2021-12-18 DIAGNOSIS — M1611 Unilateral primary osteoarthritis, right hip: Secondary | ICD-10-CM | POA: Diagnosis not present

## 2021-12-20 DIAGNOSIS — K219 Gastro-esophageal reflux disease without esophagitis: Secondary | ICD-10-CM | POA: Diagnosis not present

## 2021-12-20 DIAGNOSIS — K2289 Other specified disease of esophagus: Secondary | ICD-10-CM | POA: Diagnosis not present

## 2021-12-20 DIAGNOSIS — K6389 Other specified diseases of intestine: Secondary | ICD-10-CM | POA: Diagnosis not present

## 2021-12-20 DIAGNOSIS — K2930 Chronic superficial gastritis without bleeding: Secondary | ICD-10-CM | POA: Diagnosis not present

## 2021-12-21 ENCOUNTER — Other Ambulatory Visit: Payer: Self-pay | Admitting: Gastroenterology

## 2022-01-08 ENCOUNTER — Encounter (HOSPITAL_COMMUNITY): Payer: Self-pay | Admitting: Gastroenterology

## 2022-01-16 ENCOUNTER — Ambulatory Visit (HOSPITAL_COMMUNITY)
Admission: RE | Admit: 2022-01-16 | Discharge: 2022-01-16 | Disposition: A | Discharge status: Home / Self Care | Payer: Medicare HMO | Source: Ambulatory Visit | Attending: Gastroenterology | Admitting: Gastroenterology

## 2022-01-16 ENCOUNTER — Encounter (HOSPITAL_COMMUNITY)
Admission: RE | Disposition: A | Discharge status: Home / Self Care | Payer: Self-pay | Source: Ambulatory Visit | Attending: Gastroenterology

## 2022-01-16 ENCOUNTER — Encounter (HOSPITAL_COMMUNITY): Payer: Self-pay | Admitting: Gastroenterology

## 2022-01-16 DIAGNOSIS — R1310 Dysphagia, unspecified: Secondary | ICD-10-CM | POA: Insufficient documentation

## 2022-01-16 DIAGNOSIS — K222 Esophageal obstruction: Secondary | ICD-10-CM | POA: Insufficient documentation

## 2022-01-16 HISTORY — PX: ESOPHAGEAL MANOMETRY: SHX5429

## 2022-01-16 SURGERY — MANOMETRY, ESOPHAGUS

## 2022-01-16 MED ORDER — LIDOCAINE VISCOUS HCL 2 % MT SOLN
OROMUCOSAL | Status: AC
  Filled 2022-01-16: qty 15

## 2022-01-16 SURGICAL SUPPLY — 2 items
FACESHIELD LNG OPTICON STERILE (SAFETY) IMPLANT
GLOVE BIO SURGEON STRL SZ8 (GLOVE) ×6 IMPLANT

## 2022-01-16 NOTE — Progress Notes (Signed)
Esophageal Manometry done per protocol. Patient tolerated well without distress or complication.  

## 2022-01-18 DIAGNOSIS — R1310 Dysphagia, unspecified: Secondary | ICD-10-CM | POA: Diagnosis not present

## 2022-01-21 DIAGNOSIS — M25551 Pain in right hip: Secondary | ICD-10-CM | POA: Diagnosis not present

## 2022-01-21 DIAGNOSIS — M1611 Unilateral primary osteoarthritis, right hip: Secondary | ICD-10-CM | POA: Diagnosis not present

## 2022-01-29 ENCOUNTER — Other Ambulatory Visit: Payer: Self-pay | Admitting: Gastroenterology

## 2022-02-18 ENCOUNTER — Ambulatory Visit: Payer: Medicare HMO | Admitting: Podiatry

## 2022-02-18 DIAGNOSIS — E7800 Pure hypercholesterolemia, unspecified: Secondary | ICD-10-CM | POA: Diagnosis not present

## 2022-02-18 DIAGNOSIS — Z01818 Encounter for other preprocedural examination: Secondary | ICD-10-CM | POA: Diagnosis not present

## 2022-02-18 DIAGNOSIS — I10 Essential (primary) hypertension: Secondary | ICD-10-CM | POA: Diagnosis not present

## 2022-02-18 DIAGNOSIS — Z0000 Encounter for general adult medical examination without abnormal findings: Secondary | ICD-10-CM | POA: Diagnosis not present

## 2022-02-18 DIAGNOSIS — M1611 Unilateral primary osteoarthritis, right hip: Secondary | ICD-10-CM | POA: Diagnosis not present

## 2022-02-18 DIAGNOSIS — Z1331 Encounter for screening for depression: Secondary | ICD-10-CM | POA: Diagnosis not present

## 2022-02-18 DIAGNOSIS — F439 Reaction to severe stress, unspecified: Secondary | ICD-10-CM | POA: Diagnosis not present

## 2022-02-25 DIAGNOSIS — H5203 Hypermetropia, bilateral: Secondary | ICD-10-CM | POA: Diagnosis not present

## 2022-02-26 ENCOUNTER — Ambulatory Visit: Payer: Medicare HMO | Admitting: Podiatry

## 2022-02-26 ENCOUNTER — Encounter: Payer: Self-pay | Admitting: Podiatry

## 2022-02-26 DIAGNOSIS — M79674 Pain in right toe(s): Secondary | ICD-10-CM

## 2022-02-26 DIAGNOSIS — B351 Tinea unguium: Secondary | ICD-10-CM | POA: Diagnosis not present

## 2022-02-26 DIAGNOSIS — M79675 Pain in left toe(s): Secondary | ICD-10-CM

## 2022-03-04 NOTE — Progress Notes (Signed)
  Subjective:  Patient ID: Danielle Sexton, female    DOB: November 20, 1945,  MRN: [id number]  Danielle Sexton presents to clinic today for painful thick toenails that are difficult to trim. Pain interferes with ambulation. Aggravating factors include wearing enclosed shoe gear. Pain is relieved with periodic professional debridement.  Patient states she is having right hip surgery in June.  New problem(s): None.   PCP is Carol Ada, MD , and last visit was Feb 18, 2022.  Allergies  Allergen Reactions   Latex Other (See Comments) and Swelling    Review of Systems: Negative except as noted in the HPI.  Objective: No changes noted in today's physical examination. Constitutional Danielle Sexton is a pleasant 76 y.o. African American female, in NAD. AAO x 3.   Vascular CFT immediate b/l LE. Palpable DP/PT pulses b/l LE. Digital hair sparse b/l. Skin temperature gradient WNL b/l. No pain with calf compression b/l. No edema noted b/l. No cyanosis or clubbing noted b/l LE.  Neurologic Normal speech. Oriented to person, place, and time. Protective sensation intact 5/5 intact bilaterally with 10g monofilament b/l. Vibratory sensation intact b/l.  Dermatologic Pedal integument with normal turgor, texture and tone BLE. No open wounds b/l LE. No interdigital macerations noted b/l LE. Toenails 1-5 b/l elongated, discolored, dystrophic, thickened, crumbly with subungual debris and tenderness to dorsal palpation. Porokeratotic lesion(s) R hallux and submet head 5 left foot. No erythema, no edema, no drainage, no fluctuance.  Orthopedic: Muscle strength 5/5 to all lower extremity muscle groups bilaterally. No pain, crepitus or joint limitation noted with ROM bilateral LE. HAV with bunion deformity noted b/l LE. Hammertoe deformity noted 2-5 b/l. Pes planus deformity noted bilateral LE.   Radiographs: None Assessment/Plan: 1. Pain due to onychomycosis of toenails of both feet     -Patient was  evaluated and treated. All patient's and/or POA's questions/concerns answered on today's visit. -Patient declines paring of porokeratosis due to it not being covered. -Patient to continue soft, supportive shoe gear daily. -Toenails 1-5 b/l were debrided in length and girth with sterile nail nippers and dremel without iatrogenic bleeding.  -Patient/POA to call should there be question/concern in the interim.   Return in about 3 months (around 05/29/2022).  Marzetta Board, DPM

## 2022-03-26 NOTE — Progress Notes (Addendum)
COVID Vaccine Completed:  Yes  Date of COVID positive in last 90 days: N/A  PCP - Carol Ada, MD (on chart) Cardiologist - N/A  Chest x-ray - N/A EKG -02-18-22 at PCP (on chart) Stress Test - N/A ECHO - greater than 2 years Epic Cardiac Cath - N/A Pacemaker/ICD device last checked: Spinal Cord Stimulator:  Bowel Prep - N/A  Sleep Study - N/A CPAP -   Fasting Blood Sugar - N/A Checks Blood Sugar _____ times a day  Blood Thinner Instructions:  N/A Aspirin Instructions: Last Dose:  Activity level:   Can go up a flight of stairs and perform activities of daily living without stopping and without symptoms of chest pain or shortness of breath.  Some limitations due to hip pain.  Anesthesia review:  Heart murmur, HTN   BP elevated at PAT 171/68 and on recheck 176/77.  Patient denies headache, chest pain or SOB.  Patient very anxious at PAT and stated that she has been recently started on Alprazolam for anxiety.  Patient denies shortness of breath, fever, cough and chest pain at PAT appointment  Patient verbalized understanding of instructions that were given to them at the PAT appointment. Patient was also instructed that they will need to review over the PAT instructions again at home before surgery.

## 2022-03-26 NOTE — Patient Instructions (Addendum)
DUE TO COVID-19 ONLY TWO VISITORS  (aged 77 and older)  IS ALLOWED TO COME WITH YOU AND STAY IN THE WAITING ROOM ONLY DURING PRE OP AND PROCEDURE.   **NO VISITORS ARE ALLOWED IN THE SHORT STAY AREA OR RECOVERY ROOM!!**  IF YOU WILL BE ADMITTED INTO THE HOSPITAL YOU ARE ALLOWED ONLY FOUR SUPPORT PEOPLE DURING VISITATION HOURS ONLY (7 AM -8PM)   The support person(s) must pass our screening, gel in and out Visitors GUEST BADGE MUST BE WORN VISIBLY  One adult visitor may remain with you overnight and MUST be in the room by 8 P.M.   You are not required to [employer] often Do NOT share personal items Notify your provider if you are in close contact with someone who has COVID or you develop fever 100.4 or greater, new onset of sneezing, cough, sore throat, shortness of breath or body aches.        Your procedure is scheduled on:  04-11-22   Report to [facility] Main Entrance    Report to admitting at 6:10 AM   Call this number if you have problems the morning of surgery [phone]   Do not eat food :After Midnight the night before surgery   After Midnight you may have the following liquids until 5:30 AM DAY OF SURGERY  Clear Liquid Diet Water Black Coffee (sugar ok, NO MILK/CREAM OR CREAMERS)  Tea (sugar ok, NO MILK/CREAM OR CREAMERS) regular and decaf                             Plain Jell-O (NO RED)                                           Fruit ices (not with fruit pulp, NO RED)                                     Popsicles (NO RED)                                                                  Juice: apple, WHITE grape, WHITE cranberry Sports drinks like Gatorade (NO RED) Clear broth(vegetable,chicken,beef)                   The day of surgery:  Drink ONE (1) Pre-Surgery Clear Ensure at 5:30 AM the morning of surgery. Drink in one sitting. Do not sip.  This drink was given to you during your hospital  pre-op appointment visit. Nothing else to  drink after completing the Pre-Surgery Clear Ensure           If you have questions, please contact your surgeon's office.   FOLLOW ANY ADDITIONAL PRE OP INSTRUCTIONS YOU RECEIVED FROM YOUR SURGEON'S OFFICE!!!     Oral Hygiene is also important to reduce your risk of infection.  Remember - BRUSH YOUR TEETH THE MORNING OF SURGERY WITH YOUR REGULAR TOOTHPASTE   Do NOT smoke after Midnight   Take these medicines the morning of surgery with A SIP OF WATER: Amlodipine, Allegra, Hydralazine, Singulair, Alprazolam.  Okay to use Tylenol   DO NOT [place]. PHARMACY WILL DISPENSE MEDICATIONS LISTED ON YOUR MEDICATION LIST TO YOU DURING YOUR ADMISSION [place]!                              You may not have any metal on your body including hair pins, jewelry, and body piercing             Do not wear make-up, lotions, powders, perfumes or deodorant  Do not wear nail polish including gel and S&S, artificial/acrylic nails, or any other type of covering on natural nails including finger and toenails. If you have artificial nails, gel coating, etc. that needs to be removed by a nail salon please have this removed prior to surgery or surgery may need to be canceled/ delayed if the surgeon/ anesthesia feels like they are unable to be safely monitored.   Do not shave  48 hours prior to surgery.    Contacts, dentures or bridgework may not be worn into surgery.   Bring small overnight bag day of surgery.  Do not bring valuables to the hospital. [place].   Please read over the following fact sheets you were given: IF YOU HAVE QUESTIONS ABOUT YOUR PRE-OP INSTRUCTIONS PLEASE CALL [place] - Preparing for Surgery Before surgery, you can play an important role.  Because skin is not sterile, your skin needs to be as free of germs as possible.  You can reduce the number of  germs on your skin by washing with CHG (chlorahexidine gluconate) soap before surgery.  CHG is an antiseptic cleaner which kills germs and bonds with the skin to continue killing germs even after washing. Please DO NOT use if you have an allergy to CHG or antibacterial soaps.  If your skin becomes reddened/irritated stop using the CHG and inform your nurse when you arrive at Short Stay. Do not shave (including legs and underarms) for at least 48 hours prior to the first CHG shower.  You may shave your face/neck.  Please follow these instructions carefully:  1.  Shower with CHG Soap the night before surgery and the  morning of surgery.  2.  If you choose to wash your hair, wash your hair first as usual with your normal  shampoo.  3.  After you shampoo, rinse your hair and body thoroughly to remove the shampoo.                             4.  Use CHG as you would any other liquid soap.  You can apply chg directly to the skin and wash.  Gently with a scrungie or clean washcloth.  5.  Apply the CHG Soap to your body ONLY FROM THE NECK DOWN.   Do   not use on face/ open                           Wound or open sores. Avoid contact with eyes, ears mouth and   genitals (private parts).  Wash face,  Genitals (private parts) with your normal soap.             6.  Wash thoroughly, paying special attention to the area where your    surgery  will be performed.  7.  Thoroughly rinse your body with warm water from the neck down.  8.  DO NOT shower/wash with your normal soap after using and rinsing off the CHG Soap.                9.  Pat yourself dry with a clean towel.            10.  Wear clean pajamas.            11.  Place clean sheets on your bed the night of your first shower and do not  sleep with pets. Day of Surgery : Do not apply any lotions/deodorants the morning of surgery.  Please wear clean clothes to the hospital/surgery center.  FAILURE TO FOLLOW THESE INSTRUCTIONS MAY  RESULT IN THE CANCELLATION OF YOUR SURGERY  PATIENT SIGNATURE_________________________________  NURSE SIGNATURE__________________________________  ________________________________________________________________________     Adam Phenix  An incentive spirometer is a tool that can help keep your lungs clear and active. This tool measures how well you are filling your lungs with each breath. Taking long deep breaths may help reverse or decrease the chance of developing breathing (pulmonary) problems (especially infection) following: A long period of time when you are unable to move or be active. BEFORE THE PROCEDURE  If the spirometer includes an indicator to show your best effort, your nurse or respiratory therapist will set it to a desired goal. If possible, sit up straight or lean slightly forward. Try not to slouch. Hold the incentive spirometer in an upright position. INSTRUCTIONS FOR USE  Sit on the edge of your bed if possible, or sit up as far as you can in bed or on a chair. Hold the incentive spirometer in an upright position. Breathe out normally. Place the mouthpiece in your mouth and seal your lips tightly around it. Breathe in slowly and as deeply as possible, raising the piston or the ball toward the top of the column. Hold your breath for 3-5 seconds or for as long as possible. Allow the piston or ball to fall to the bottom of the column. Remove the mouthpiece from your mouth and breathe out normally. Rest for a few seconds and repeat Steps 1 through 7 at least 10 times every 1-2 hours when you are awake. Take your time and take a few normal breaths between deep breaths. The spirometer may include an indicator to show your best effort. Use the indicator as a goal to work toward during each repetition. After each set of 10 deep breaths, practice coughing to be sure your lungs are clear. If you have an incision (the cut made at the time of surgery), support your  incision when coughing by placing a pillow or rolled up towels firmly against it. Once you are able to get out of bed, walk around indoors and cough well. You may stop using the incentive spirometer when instructed by your caregiver.  RISKS AND COMPLICATIONS Take your time so you do not get dizzy or light-headed. If you are in pain, you may need to take or ask for pain medication before doing incentive spirometry. It is harder to take a deep breath if you are having pain. AFTER USE Rest and breathe slowly and easily. It can  be helpful to keep track of a log of your progress. Your caregiver can provide you with a simple table to help with this. If you are using the spirometer at home, follow these instructions: [place] IF:  You are having difficultly using the spirometer. You have trouble using the spirometer as often as instructed. Your pain medication is not giving enough relief while using the spirometer. You develop fever of 100.5 F (38.1 C) or higher. SEEK IMMEDIATE MEDICAL CARE IF:  You cough up bloody sputum that had not been present before. You develop fever of 102 F (38.9 C) or greater. You develop worsening pain at or near the incision site. MAKE SURE YOU:  Understand these instructions. Will watch your condition. Will get help right away if you are not doing well or get worse. Document Released: 02/10/2007 Document Revised: 12/23/2011 Document Reviewed: 04/13/2007 ExitCare Patient Information 2014 Exit[employer], [place].   ________________________________________________________________________  WHAT IS A BLOOD TRANSFUSION? Blood Transfusion Information  A transfusion is the replacement of blood or some of its parts. Blood is made up of multiple cells which provide different functions. Red blood cells carry oxygen and are used for blood loss replacement. White blood cells fight against infection. Platelets control bleeding. Plasma helps clot blood. Other blood  products are available for specialized needs, such as hemophilia or other clotting disorders. BEFORE THE TRANSFUSION  Who gives blood for transfusions?  Healthy volunteers who are fully evaluated to make sure their blood is safe. This is blood bank blood. Transfusion therapy is the safest it has ever been in the practice of medicine. Before blood is taken from a donor, a complete history is taken to make sure that person has no history of diseases nor engages in risky social behavior (examples are intravenous drug use or sexual activity with multiple partners). The donor's travel history is screened to minimize risk of transmitting infections, such as malaria. The donated blood is tested for signs of infectious diseases, such as HIV and hepatitis. The blood is then tested to be sure it is compatible with you in order to minimize the chance of a transfusion reaction. If you or a relative donates blood, this is often done in anticipation of surgery and is not appropriate for emergency situations. It takes many days to process the donated blood. RISKS AND COMPLICATIONS Although transfusion therapy is very safe and saves many lives, the main dangers of transfusion include:  Getting an infectious disease. Developing a transfusion reaction. This is an allergic reaction to something in the blood you were given. Every precaution is taken to prevent this. The decision to have a blood transfusion has been considered carefully by your caregiver before blood is given. Blood is not given unless the benefits outweigh the risks. AFTER THE TRANSFUSION Right after receiving a blood transfusion, you will usually feel much better and more energetic. This is especially true if your red blood cells have gotten low (anemic). The transfusion raises the level of the red blood cells which carry oxygen, and this usually causes an energy increase. The nurse administering the transfusion will monitor you carefully for  complications. HOME CARE INSTRUCTIONS  No special instructions are needed after a transfusion. You may find your energy is better. Speak with your caregiver about any limitations on activity for underlying diseases you may have. SEEK MEDICAL CARE IF:  Your condition is not improving after your transfusion. You develop redness or irritation at the intravenous (IV) site. SEEK IMMEDIATE MEDICAL CARE IF:  Any of the following symptoms occur over the next 12 hours: Shaking chills. You have a temperature by mouth above 102 F (38.9 C), not controlled by medicine. Chest, back, or muscle pain. People around you feel you are not acting correctly or are confused. Shortness of breath or difficulty breathing. Dizziness and fainting. You get a rash or develop hives. You have a decrease in urine output. Your urine turns a dark color or changes to pink, red, or brown. Any of the following symptoms occur over the next 10 days: You have a temperature by mouth above 102 F (38.9 C), not controlled by medicine. Shortness of breath. Weakness after normal activity. The white part of the eye turns yellow (jaundice). You have a decrease in the amount of urine or are urinating less often. Your urine turns a dark color or changes to pink, red, or brown. Document Released: 09/27/2000 Document Revised: 12/23/2011 Document Reviewed: 05/16/2008 [facility] Patient Information 2014 [place], [place].  _______________________________________________________________________

## 2022-03-29 ENCOUNTER — Encounter (HOSPITAL_COMMUNITY)
Admission: RE | Admit: 2022-03-29 | Discharge: 2022-03-29 | Disposition: A | Discharge status: Home / Self Care | Payer: Medicare HMO | Source: Ambulatory Visit | Attending: Orthopedic Surgery | Admitting: Orthopedic Surgery

## 2022-03-29 ENCOUNTER — Other Ambulatory Visit: Payer: Self-pay

## 2022-03-29 ENCOUNTER — Encounter (HOSPITAL_COMMUNITY): Payer: Self-pay

## 2022-03-29 VITALS — BP 176/77 | HR 72 | Temp 98.5°F | Resp 20 | Ht 63.0 in | Wt 193.8 lb

## 2022-03-29 DIAGNOSIS — Z01818 Encounter for other preprocedural examination: Secondary | ICD-10-CM | POA: Diagnosis not present

## 2022-03-29 DIAGNOSIS — M1611 Unilateral primary osteoarthritis, right hip: Secondary | ICD-10-CM

## 2022-03-29 DIAGNOSIS — I2510 Atherosclerotic heart disease of native coronary artery without angina pectoris: Secondary | ICD-10-CM | POA: Insufficient documentation

## 2022-03-29 HISTORY — DX: Personal history of other diseases of the digestive system: Z87.19

## 2022-03-29 HISTORY — DX: Cardiac murmur, unspecified: R01.1

## 2022-03-29 HISTORY — DX: Gastro-esophageal reflux disease without esophagitis: K21.9

## 2022-03-29 HISTORY — DX: Anxiety disorder, unspecified: F41.9

## 2022-03-29 LAB — BASIC METABOLIC PANEL
Anion gap: 6 (ref 5–15)
BUN: 14 mg/dL (ref 8–23)
CO2: 25 mmol/L (ref 22–32)
Calcium: 9.9 mg/dL (ref 8.9–10.3)
Chloride: 107 mmol/L (ref 98–111)
Creatinine, Ser: 0.63 mg/dL (ref 0.44–1.00)
GFR, Estimated: >60 mL/min (ref >60)
Glucose, Bld: 96 mg/dL (ref 70–99)
Potassium: 3.7 mmol/L (ref 3.5–5.1)
Sodium: 138 mmol/L (ref 135–145)

## 2022-03-29 LAB — TYPE AND SCREEN
ABO/RH(D): A POS
Antibody Screen: NEGATIVE

## 2022-03-29 LAB — CBC
HCT: 36.6 % (ref 36.0–46.0)
Hemoglobin: 12.2 g/dL (ref 12.0–15.0)
MCH: 32.2 pg (ref 26.0–34.0)
MCHC: 33.3 g/dL (ref 30.0–36.0)
MCV: 96.6 fL (ref 80.0–100.0)
Platelets: 187 10*3/uL (ref 150–400)
RBC: 3.79 MIL/uL — ABNORMAL LOW (ref 3.87–5.11)
RDW: 13 % (ref 11.5–15.5)
WBC: 7.2 10*3/uL (ref 4.0–10.5)
nRBC: 0 % (ref 0.0–0.2)

## 2022-03-29 LAB — SURGICAL PCR SCREEN
MRSA, PCR: NEGATIVE
Staphylococcus aureus: NEGATIVE

## 2022-04-01 ENCOUNTER — Encounter (HOSPITAL_COMMUNITY): Payer: Self-pay

## 2022-04-10 NOTE — Anesthesia Preprocedure Evaluation (Addendum)
Anesthesia Evaluation  Patient identified by MRN, date of birth, ID band Patient awake    Reviewed: Allergy & Precautions, NPO status , Patient's Chart, lab work & pertinent test results  Airway Mallampati: II  TM Distance: >3 FB Neck ROM: Full    Dental no notable dental hx.    Pulmonary neg pulmonary ROS,    Pulmonary exam normal breath sounds clear to auscultation       Cardiovascular hypertension, Pt. on medications Normal cardiovascular exam Rhythm:Regular Rate:Normal     Neuro/Psych PSYCHIATRIC DISORDERS Anxiety negative neurological ROS     GI/Hepatic Neg liver ROS, hiatal hernia, GERD  ,  Endo/Other  Hyperlipidemia  Renal/GU negative Renal ROS  negative genitourinary   Musculoskeletal  (+) Arthritis , Osteoarthritis,    Abdominal   Peds negative pediatric ROS (+)  Hematology negative hematology ROS (+)   Anesthesia Other Findings   Reproductive/Obstetrics negative OB ROS                            Anesthesia Physical Anesthesia Plan  ASA: 2  Anesthesia Plan: Spinal and MAC   Post-op Pain Management: Tylenol PO (pre-op)*   Induction: Intravenous  PONV Risk Score and Plan: 2 and Propofol infusion, TIVA, Treatment may vary due to age or medical condition, Midazolam, Ondansetron and Dexamethasone  Airway Management Planned: Natural Airway and Simple Face Mask  Additional Equipment: None  Intra-op Plan:   Post-operative Plan:   Informed Consent: I have reviewed the patients History and Physical, chart, labs and discussed the procedure including the risks, benefits and alternatives for the proposed anesthesia with the patient or authorized representative who has indicated his/her understanding and acceptance.     Dental advisory given  Plan Discussed with: CRNA, Surgeon and Anesthesiologist  Anesthesia Plan Comments: (Spinal. GA/LMA as backup plan. Norton Blizzard, MD  )         Anesthesia Quick Evaluation

## 2022-04-11 ENCOUNTER — Ambulatory Visit (HOSPITAL_COMMUNITY): Payer: Medicare HMO | Admitting: Physician Assistant

## 2022-04-11 ENCOUNTER — Ambulatory Visit (HOSPITAL_COMMUNITY): Payer: Medicare HMO

## 2022-04-11 ENCOUNTER — Other Ambulatory Visit: Payer: Self-pay

## 2022-04-11 ENCOUNTER — Encounter (HOSPITAL_COMMUNITY)
Admission: RE | Disposition: A | Discharge status: Home / Self Care | Payer: Self-pay | Source: Ambulatory Visit | Attending: Orthopedic Surgery

## 2022-04-11 ENCOUNTER — Observation Stay (HOSPITAL_COMMUNITY): Payer: Medicare HMO

## 2022-04-11 ENCOUNTER — Observation Stay (HOSPITAL_COMMUNITY)
Admission: RE | Admit: 2022-04-11 | Discharge: 2022-04-12 | Disposition: A | Discharge status: Home / Self Care | Payer: Medicare HMO | Source: Ambulatory Visit | Attending: Orthopedic Surgery | Admitting: Orthopedic Surgery

## 2022-04-11 ENCOUNTER — Encounter (HOSPITAL_COMMUNITY): Payer: Self-pay | Admitting: Orthopedic Surgery

## 2022-04-11 ENCOUNTER — Ambulatory Visit (HOSPITAL_BASED_OUTPATIENT_CLINIC_OR_DEPARTMENT_OTHER): Payer: Medicare HMO | Admitting: Anesthesiology

## 2022-04-11 DIAGNOSIS — M1611 Unilateral primary osteoarthritis, right hip: Principal | ICD-10-CM

## 2022-04-11 DIAGNOSIS — I2510 Atherosclerotic heart disease of native coronary artery without angina pectoris: Secondary | ICD-10-CM

## 2022-04-11 DIAGNOSIS — Z96641 Presence of right artificial hip joint: Secondary | ICD-10-CM | POA: Diagnosis not present

## 2022-04-11 DIAGNOSIS — I10 Essential (primary) hypertension: Secondary | ICD-10-CM | POA: Insufficient documentation

## 2022-04-11 DIAGNOSIS — Z91040 Latex allergy status: Secondary | ICD-10-CM | POA: Insufficient documentation

## 2022-04-11 DIAGNOSIS — Z471 Aftercare following joint replacement surgery: Secondary | ICD-10-CM | POA: Diagnosis not present

## 2022-04-11 DIAGNOSIS — Z01818 Encounter for other preprocedural examination: Secondary | ICD-10-CM

## 2022-04-11 HISTORY — PX: TOTAL HIP ARTHROPLASTY: SHX124

## 2022-04-11 LAB — ABO/RH: ABO/RH(D): A POS

## 2022-04-11 SURGERY — ARTHROPLASTY, HIP, TOTAL, ANTERIOR APPROACH
Anesthesia: Monitor Anesthesia Care | Site: Hip | Laterality: Right

## 2022-04-11 MED ORDER — LORATADINE 10 MG PO TABS
10.0000 mg | ORAL_TABLET | Freq: Every day | ORAL | Status: DC
  Administered 2022-04-12: 10 mg via ORAL
  Filled 2022-04-11: qty 1

## 2022-04-11 MED ORDER — ONDANSETRON HCL 4 MG/2ML IJ SOLN: 4.0000 mg | Freq: Once | INTRAMUSCULAR | Status: DC | PRN

## 2022-04-11 MED ORDER — HYDRALAZINE HCL 50 MG PO TABS
50.0000 mg | ORAL_TABLET | Freq: Two times a day (BID) | ORAL | Status: DC
  Administered 2022-04-11 – 2022-04-12 (×2): 50 mg via ORAL
  Filled 2022-04-11 (×2): qty 1

## 2022-04-11 MED ORDER — ASPIRIN 81 MG PO CHEW
81.0000 mg | CHEWABLE_TABLET | Freq: Two times a day (BID) | ORAL | Status: DC
  Administered 2022-04-11 – 2022-04-12 (×2): 81 mg via ORAL
  Filled 2022-04-11 (×2): qty 1

## 2022-04-11 MED ORDER — POLYETHYLENE GLYCOL 3350 17 G PO PACK
17.0000 g | PACK | Freq: Every day | ORAL | Status: DC | PRN
Start: 2022-04-11 — End: 2022-04-12

## 2022-04-11 MED ORDER — HYDROCODONE-ACETAMINOPHEN 5-325 MG PO TABS
1.0000 | ORAL_TABLET | ORAL | Status: DC | PRN
  Administered 2022-04-12: 1 via ORAL
  Filled 2022-04-11: qty 1

## 2022-04-11 MED ORDER — AMISULPRIDE (ANTIEMETIC) 5 MG/2ML IV SOLN: 10.0000 mg | Freq: Once | INTRAVENOUS | Status: DC | PRN

## 2022-04-11 MED ORDER — OXYCODONE HCL 5 MG PO TABS: 5.0000 mg | ORAL_TABLET | Freq: Once | ORAL | Status: DC | PRN

## 2022-04-11 MED ORDER — ORAL CARE MOUTH RINSE: 15.0000 mL | Freq: Once | OROMUCOSAL | Status: AC

## 2022-04-11 MED ORDER — FLUTICASONE PROPIONATE 50 MCG/ACT NA SUSP: 1.0000 | Freq: Every day | NASAL | Status: DC | PRN

## 2022-04-11 MED ORDER — IRBESARTAN 150 MG PO TABS
300.0000 mg | ORAL_TABLET | Freq: Every day | ORAL | Status: DC
  Administered 2022-04-12: 300 mg via ORAL
  Filled 2022-04-11: qty 2

## 2022-04-11 MED ORDER — HYDROCODONE-ACETAMINOPHEN 7.5-325 MG PO TABS
1.0000 | ORAL_TABLET | ORAL | Status: DC | PRN
  Administered 2022-04-11 – 2022-04-12 (×3): 1 via ORAL
  Filled 2022-04-11 (×3): qty 1

## 2022-04-11 MED ORDER — ACETAMINOPHEN 500 MG PO TABS
1000.0000 mg | ORAL_TABLET | Freq: Once | ORAL | Status: AC
Start: 2022-04-11 — End: 2022-04-11
  Administered 2022-04-11: 1000 mg via ORAL
  Filled 2022-04-11: qty 2

## 2022-04-11 MED ORDER — BUPIVACAINE IN DEXTROSE 0.75-8.25 % IT SOLN
INTRATHECAL | Status: DC | PRN
  Administered 2022-04-11: 1.6 mL via INTRATHECAL

## 2022-04-11 MED ORDER — METHOCARBAMOL 500 MG IVPB - SIMPLE MED
500.0000 mg | Freq: Four times a day (QID) | INTRAVENOUS | Status: DC | PRN
  Filled 2022-04-11: qty 55

## 2022-04-11 MED ORDER — DEXAMETHASONE SODIUM PHOSPHATE 10 MG/ML IJ SOLN: 8.0000 mg | Freq: Once | INTRAMUSCULAR | Status: DC

## 2022-04-11 MED ORDER — ONDANSETRON HCL 4 MG/2ML IJ SOLN: 4.0000 mg | Freq: Four times a day (QID) | INTRAMUSCULAR | Status: DC | PRN

## 2022-04-11 MED ORDER — OXYCODONE HCL 5 MG/5ML PO SOLN: 5.0000 mg | Freq: Once | ORAL | Status: DC | PRN

## 2022-04-11 MED ORDER — AMLODIPINE BESYLATE 10 MG PO TABS
10.0000 mg | ORAL_TABLET | Freq: Every day | ORAL | Status: DC
  Administered 2022-04-12: 10 mg via ORAL
  Filled 2022-04-11: qty 1

## 2022-04-11 MED ORDER — PROPOFOL 1000 MG/100ML IV EMUL
INTRAVENOUS | Status: AC
  Filled 2022-04-11: qty 100

## 2022-04-11 MED ORDER — LACTATED RINGERS IV SOLN: INTRAVENOUS | Status: DC

## 2022-04-11 MED ORDER — FENTANYL CITRATE (PF) 100 MCG/2ML IJ SOLN
INTRAMUSCULAR | Status: DC | PRN
  Administered 2022-04-11: 100 ug via INTRAVENOUS

## 2022-04-11 MED ORDER — PHENOL 1.4 % MT LIQD: 1.0000 | OROMUCOSAL | Status: DC | PRN

## 2022-04-11 MED ORDER — PROPOFOL 500 MG/50ML IV EMUL
INTRAVENOUS | Status: DC | PRN
  Administered 2022-04-11: 50 ug/kg/min via INTRAVENOUS

## 2022-04-11 MED ORDER — PROPOFOL 10 MG/ML IV BOLUS
INTRAVENOUS | Status: AC
  Filled 2022-04-11: qty 20

## 2022-04-11 MED ORDER — POVIDONE-IODINE 10 % EX SWAB: Freq: Once | CUTANEOUS | Status: AC

## 2022-04-11 MED ORDER — MENTHOL 3 MG MT LOZG: 1.0000 | LOZENGE | OROMUCOSAL | Status: DC | PRN

## 2022-04-11 MED ORDER — METOCLOPRAMIDE HCL 5 MG/ML IJ SOLN: 5.0000 mg | Freq: Three times a day (TID) | INTRAMUSCULAR | Status: DC | PRN

## 2022-04-11 MED ORDER — BISACODYL 10 MG RE SUPP: 10.0000 mg | Freq: Every day | RECTAL | Status: DC | PRN

## 2022-04-11 MED ORDER — PHENYLEPHRINE HCL (PRESSORS) 10 MG/ML IV SOLN
INTRAVENOUS | Status: AC
  Filled 2022-04-11: qty 1

## 2022-04-11 MED ORDER — ACETAMINOPHEN 500 MG PO TABS: 1000.0000 mg | ORAL_TABLET | Freq: Once | ORAL | Status: AC

## 2022-04-11 MED ORDER — PHENYLEPHRINE HCL-NACL 20-0.9 MG/250ML-% IV SOLN
INTRAVENOUS | Status: DC | PRN
  Administered 2022-04-11: 30 ug/min via INTRAVENOUS

## 2022-04-11 MED ORDER — METOCLOPRAMIDE HCL 5 MG PO TABS: 5.0000 mg | ORAL_TABLET | Freq: Three times a day (TID) | ORAL | Status: DC | PRN

## 2022-04-11 MED ORDER — FENTANYL CITRATE PF 50 MCG/ML IJ SOSY: 25.0000 ug | PREFILLED_SYRINGE | INTRAMUSCULAR | Status: DC | PRN

## 2022-04-11 MED ORDER — METHOCARBAMOL 500 MG PO TABS
500.0000 mg | ORAL_TABLET | Freq: Four times a day (QID) | ORAL | Status: DC | PRN
  Administered 2022-04-11 – 2022-04-12 (×2): 500 mg via ORAL
  Filled 2022-04-11 (×2): qty 1

## 2022-04-11 MED ORDER — SODIUM CHLORIDE 0.9 % IR SOLN
Status: DC | PRN
  Administered 2022-04-11: 1000 mL

## 2022-04-11 MED ORDER — DEXAMETHASONE SODIUM PHOSPHATE 10 MG/ML IJ SOLN
INTRAMUSCULAR | Status: DC | PRN
  Administered 2022-04-11: 10 mg via INTRAVENOUS

## 2022-04-11 MED ORDER — FERROUS SULFATE 325 (65 FE) MG PO TABS
325.0000 mg | ORAL_TABLET | Freq: Three times a day (TID) | ORAL | Status: DC
Start: 2022-04-12 — End: 2022-04-12
  Administered 2022-04-12: 325 mg via ORAL
  Filled 2022-04-11: qty 1

## 2022-04-11 MED ORDER — LIDOCAINE 2% (20 MG/ML) 5 ML SYRINGE
INTRAMUSCULAR | Status: DC | PRN
  Administered 2022-04-11: 100 mg via INTRAVENOUS

## 2022-04-11 MED ORDER — MIDAZOLAM HCL 2 MG/2ML IJ SOLN
INTRAMUSCULAR | Status: AC
  Filled 2022-04-11: qty 2

## 2022-04-11 MED ORDER — ALBUMIN HUMAN 5 % IV SOLN: INTRAVENOUS | Status: DC | PRN

## 2022-04-11 MED ORDER — MORPHINE SULFATE (PF) 2 MG/ML IV SOLN: 0.5000 mg | INTRAVENOUS | Status: DC | PRN

## 2022-04-11 MED ORDER — CEFAZOLIN SODIUM-DEXTROSE 2-4 GM/100ML-% IV SOLN
2.0000 g | Freq: Four times a day (QID) | INTRAVENOUS | Status: AC
  Administered 2022-04-11 (×2): 2 g via INTRAVENOUS
  Filled 2022-04-11: qty 100

## 2022-04-11 MED ORDER — DIPHENHYDRAMINE HCL 12.5 MG/5ML PO ELIX: 12.5000 mg | ORAL_SOLUTION | ORAL | Status: DC | PRN

## 2022-04-11 MED ORDER — DEXAMETHASONE SODIUM PHOSPHATE 10 MG/ML IJ SOLN
10.0000 mg | Freq: Once | INTRAMUSCULAR | Status: AC
  Administered 2022-04-12: 10 mg via INTRAVENOUS
  Filled 2022-04-11: qty 1

## 2022-04-11 MED ORDER — MONTELUKAST SODIUM 10 MG PO TABS: 10.0000 mg | ORAL_TABLET | Freq: Every day | ORAL | Status: DC | PRN

## 2022-04-11 MED ORDER — MECLIZINE HCL 25 MG PO TABS: 25.0000 mg | ORAL_TABLET | Freq: Three times a day (TID) | ORAL | Status: DC | PRN

## 2022-04-11 MED ORDER — ONDANSETRON HCL 4 MG/2ML IJ SOLN
INTRAMUSCULAR | Status: AC
  Filled 2022-04-11: qty 2

## 2022-04-11 MED ORDER — AMLODIPINE BESYLATE 10 MG PO TABS: 10.0000 mg | ORAL_TABLET | Freq: Every day | ORAL | Status: DC

## 2022-04-11 MED ORDER — ALBUMIN HUMAN 5 % IV SOLN
INTRAVENOUS | Status: AC
  Filled 2022-04-11: qty 250

## 2022-04-11 MED ORDER — ACETAMINOPHEN 325 MG PO TABS: 325.0000 mg | ORAL_TABLET | Freq: Four times a day (QID) | ORAL | Status: DC | PRN

## 2022-04-11 MED ORDER — ONDANSETRON HCL 4 MG PO TABS: 4.0000 mg | ORAL_TABLET | Freq: Four times a day (QID) | ORAL | Status: DC | PRN

## 2022-04-11 MED ORDER — TRANEXAMIC ACID-NACL 1000-0.7 MG/100ML-% IV SOLN
1000.0000 mg | Freq: Once | INTRAVENOUS | Status: AC
  Administered 2022-04-11: 1000 mg via INTRAVENOUS
  Filled 2022-04-11: qty 100

## 2022-04-11 MED ORDER — DEXAMETHASONE SODIUM PHOSPHATE 10 MG/ML IJ SOLN
INTRAMUSCULAR | Status: AC
  Filled 2022-04-11: qty 1

## 2022-04-11 MED ORDER — TRANEXAMIC ACID-NACL 1000-0.7 MG/100ML-% IV SOLN
1000.0000 mg | INTRAVENOUS | Status: AC
  Administered 2022-04-11: 1000 mg via INTRAVENOUS
  Filled 2022-04-11: qty 100

## 2022-04-11 MED ORDER — CEFAZOLIN SODIUM-DEXTROSE 2-4 GM/100ML-% IV SOLN
2.0000 g | INTRAVENOUS | Status: AC
  Administered 2022-04-11: 2 g via INTRAVENOUS
  Filled 2022-04-11: qty 100

## 2022-04-11 MED ORDER — CHLORHEXIDINE GLUCONATE 0.12 % MT SOLN
15.0000 mL | Freq: Once | OROMUCOSAL | Status: AC
  Administered 2022-04-11: 15 mL via OROMUCOSAL

## 2022-04-11 MED ORDER — ONDANSETRON HCL 4 MG/2ML IJ SOLN
INTRAMUSCULAR | Status: DC | PRN
  Administered 2022-04-11: 4 mg via INTRAVENOUS

## 2022-04-11 MED ORDER — DOCUSATE SODIUM 100 MG PO CAPS
100.0000 mg | ORAL_CAPSULE | Freq: Two times a day (BID) | ORAL | Status: DC
Start: 2022-04-11 — End: 2022-04-12
  Administered 2022-04-11 – 2022-04-12 (×2): 100 mg via ORAL
  Filled 2022-04-11 (×2): qty 1

## 2022-04-11 MED ORDER — ALUM HYDROXIDE-MAG TRISILICATE 80-20 MG PO CHEW: 2.0000 | CHEWABLE_TABLET | Freq: Three times a day (TID) | ORAL | Status: DC | PRN

## 2022-04-11 MED ORDER — LIDOCAINE HCL (PF) 2 % IJ SOLN
INTRAMUSCULAR | Status: AC
  Filled 2022-04-11: qty 5

## 2022-04-11 MED ORDER — MIDAZOLAM HCL 5 MG/5ML IJ SOLN
INTRAMUSCULAR | Status: DC | PRN
  Administered 2022-04-11: 2 mg via INTRAVENOUS

## 2022-04-11 MED ORDER — SODIUM CHLORIDE 0.9 % IV SOLN
INTRAVENOUS | Status: DC
Start: 2022-04-11 — End: 2022-04-12

## 2022-04-11 MED ORDER — ACETAMINOPHEN 500 MG PO TABS
ORAL_TABLET | ORAL | Status: AC
  Administered 2022-04-11: 1000 mg via ORAL
  Filled 2022-04-11: qty 2

## 2022-04-11 MED ORDER — PROPOFOL 10 MG/ML IV BOLUS
INTRAVENOUS | Status: DC | PRN
  Administered 2022-04-11: 10 mg via INTRAVENOUS
  Administered 2022-04-11: 20 mg via INTRAVENOUS
  Administered 2022-04-11 (×2): 10 mg via INTRAVENOUS

## 2022-04-11 MED ORDER — FENTANYL CITRATE (PF) 100 MCG/2ML IJ SOLN
INTRAMUSCULAR | Status: AC
  Filled 2022-04-11: qty 2

## 2022-04-11 SURGICAL SUPPLY — 50 items
ADH SKN CLS APL DERMABOND .7 (GAUZE/BANDAGES/DRESSINGS) ×1
BAG COUNTER SPONGE SURGICOUNT (BAG) ×1 IMPLANT
BAG DECANTER FOR FLEXI CONT (MISCELLANEOUS) IMPLANT
BAG SPEC THK2 15X12 ZIP CLS (MISCELLANEOUS)
BAG SPNG CNTER NS LX DISP (BAG) ×1
BAG ZIPLOCK 12X15 (MISCELLANEOUS) IMPLANT
BLADE SAG 18X100X1.27 (BLADE) ×3 IMPLANT
COVER PERINEAL POST (MISCELLANEOUS) ×3 IMPLANT
COVER SURGICAL LIGHT HANDLE (MISCELLANEOUS) ×3 IMPLANT
CUP ACETBLR 52 OD PINNACLE (Hips) ×1 IMPLANT
DERMABOND ADVANCED (GAUZE/BANDAGES/DRESSINGS) ×1
DERMABOND ADVANCED .7 DNX12 (GAUZE/BANDAGES/DRESSINGS) ×2 IMPLANT
DRAPE FOOT SWITCH (DRAPES) ×3 IMPLANT
DRAPE STERI IOBAN 125X83 (DRAPES) ×3 IMPLANT
DRAPE U-SHAPE 47X51 STRL (DRAPES) ×6 IMPLANT
DRESSING AQUACEL AG SP 3.5X10 (GAUZE/BANDAGES/DRESSINGS) ×2 IMPLANT
DRSG AQUACEL AG ADV 3.5X10 (GAUZE/BANDAGES/DRESSINGS) ×1 IMPLANT
DRSG AQUACEL AG SP 3.5X10 (GAUZE/BANDAGES/DRESSINGS)
DURAPREP 26ML APPLICATOR (WOUND CARE) ×3 IMPLANT
ELECT REM PT RETURN 15FT ADLT (MISCELLANEOUS) ×3 IMPLANT
ELIMINATOR HOLE APEX DEPUY (Hips) ×1 IMPLANT
GLOVE BIO SURGEON STRL SZ 6 (GLOVE) ×2 IMPLANT
GLOVE BIOGEL PI IND STRL 6.5 (GLOVE) ×2 IMPLANT
GLOVE BIOGEL PI IND STRL 7.5 (GLOVE) ×2 IMPLANT
GLOVE BIOGEL PI INDICATOR 6.5 (GLOVE) ×1
GLOVE BIOGEL PI INDICATOR 7.5 (GLOVE) ×1
GLOVE ORTHO TXT STRL SZ7.5 (GLOVE) ×4 IMPLANT
GLOVE SURG NEOP MICRO LF SZ7.5 (GLOVE) ×3 IMPLANT
GLOVE SURG NEOPR MICRO LF SZ6 (GLOVE) ×1 IMPLANT
GOWN STRL REUS W/ TWL LRG LVL3 (GOWN DISPOSABLE) ×6 IMPLANT
GOWN STRL REUS W/TWL LRG LVL3 (GOWN DISPOSABLE) ×6
HEAD M SROM 36MM PLUS 1.5 (Hips) IMPLANT
HOLDER FOLEY CATH W/STRAP (MISCELLANEOUS) ×3 IMPLANT
KIT TURNOVER KIT A (KITS) ×1 IMPLANT
LINER NEUTRAL 52X36MM PLUS 4 (Liner) ×1 IMPLANT
PACK ANTERIOR HIP CUSTOM (KITS) ×3 IMPLANT
SCREW 6.5MMX30MM (Screw) ×1 IMPLANT
SROM M HEAD 36MM PLUS 1.5 (Hips) ×2 IMPLANT
STEM FEM ACTIS HIGH SZ3 (Stem) ×1 IMPLANT
SUT MNCRL AB 4-0 PS2 18 (SUTURE) ×3 IMPLANT
SUT STRATAFIX 0 PDS 27 VIOLET (SUTURE) ×2
SUT VIC AB 1 CT1 36 (SUTURE) ×9 IMPLANT
SUT VIC AB 2-0 CT1 27 (SUTURE) ×4
SUT VIC AB 2-0 CT1 TAPERPNT 27 (SUTURE) ×4 IMPLANT
SUTURE STRATFX 0 PDS 27 VIOLET (SUTURE) ×2 IMPLANT
TRAY FOL W/BAG SLVR 16FR STRL (SET/KITS/TRAYS/PACK) IMPLANT
TRAY FOLEY MTR SLVR 16FR STAT (SET/KITS/TRAYS/PACK) IMPLANT
TRAY FOLEY W/BAG SLVR 16FR LF (SET/KITS/TRAYS/PACK) ×2
TUBE SUCTION HIGH CAP CLEAR NV (SUCTIONS) ×3 IMPLANT
WATER STERILE IRR 1000ML POUR (IV SOLUTION) ×3 IMPLANT

## 2022-04-11 NOTE — Anesthesia Procedure Notes (Signed)
Spinal  Patient location during procedure: OR Start time: 04/11/2022 8:55 AM End time: 04/11/2022 9:02 AM Reason for block: surgical anesthesia Staffing Performed: anesthesiologist  Anesthesiologist: Merlinda Frederick, MD Performed by: Merlinda Frederick, MD Authorized by: Merlinda Frederick, MD   Preanesthetic Checklist Completed: patient identified, IV checked, risks and benefits discussed, surgical consent, monitors and equipment checked, pre-op evaluation and timeout performed Spinal Block Patient position: sitting Prep: DuraPrep Patient monitoring: cardiac monitor, continuous pulse ox and blood pressure Approach: midline Location: L3-4 Injection technique: single-shot Needle Needle type: Pencan  Needle gauge: 24 G Needle length: 9 cm Assessment Events: CSF return Additional Notes Functioning IV was confirmed and monitors were applied. Sterile prep and drape, including hand hygiene and sterile gloves were used. The patient was positioned and the spine was prepped. The skin was anesthetized with lidocaine.  Free flow of clear CSF was obtained prior to injecting local anesthetic into the CSF.  The spinal needle aspirated freely following injection.  The needle was carefully withdrawn.  The patient tolerated the procedure well.

## 2022-04-11 NOTE — Interval H&P Note (Signed)
History and Physical Interval Note:  04/11/2022 7:14 AM  Danielle Sexton  has presented today for surgery, with the diagnosis of Right hip osteoarthitis.  The various methods of treatment have been discussed with the patient and family. After consideration of risks, benefits and other options for treatment, the patient has consented to  Procedure(s): TOTAL HIP ARTHROPLASTY ANTERIOR APPROACH (Right) as a surgical intervention.  The patient's history has been reviewed, patient examined, no change in status, stable for surgery.  I have reviewed the patient's chart and labs.  Questions were answered to the patient's satisfaction.     Mauri Pole

## 2022-04-11 NOTE — Discharge Instructions (Signed)

## 2022-04-11 NOTE — H&P (Signed)
TOTAL HIP ADMISSION H&P  Patient is admitted for right total hip arthroplasty.  Subjective:  Chief Complaint: right hip pain  HPI: Danielle Sexton, 76 y.o. female, has a history of pain and functional disability in the right hip(s) due to arthritis and patient has failed non-surgical conservative treatments for greater than 12 weeks to include corticosteriod injections and activity modification.  Onset of symptoms was gradual starting 2 years ago with gradually worsening course since that time.The patient noted no past surgery on the right hip(s).  Patient currently rates pain in the right hip at 8 out of 10 with activity. Patient has worsening of pain with activity and weight bearing, pain that interfers with activities of daily living, and pain with passive range of motion. Patient has evidence of joint space narrowing by imaging studies. This condition presents safety issues increasing the risk of falls.  There is no current active infection.  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   Past Medical History:  Diagnosis Date   Achilles tendinitis of left lower extremity 11/25/2021   Allergic rhinitis 11/25/2021   Anxiety    GERD (gastroesophageal reflux disease)    Heart murmur    History of hiatal hernia    HTN (hypertension) 11/25/2021   Hyperlipidemia 11/25/2021   OA (osteoarthritis) of hip 11/25/2021   OA (osteoarthritis) of knee 11/25/2021    Past Surgical History:  Procedure Laterality Date   ABDOMINAL HYSTERECTOMY     BREAST EXCISIONAL BIOPSY Right    benign   COLONOSCOPY     ESOPHAGEAL MANOMETRY N/A 01/16/2022   Procedure:  ESOPHAGEAL MANOMETRY (EM);  Surgeon: Ronnette Juniper, MD;  Location: [facility];  Service: Gastroenterology;  Laterality: N/A;   UPPER GI ENDOSCOPY      Current Facility-Administered Medications  Medication Dose Route Frequency Provider Last Rate Last Admin   ceFAZolin (ANCEF) IVPB 2g/100 mL premix  2 g Intravenous On Call to OR Irving Copas, PA-C       chlorhexidine (PERIDEX) 0.12 % solution 15 mL  15 mL Mouth/Throat Once Lidia Collum, MD       Or   Oral care mouth rinse  15 mL Mouth Rinse Once Lidia Collum, MD       dexamethasone (DECADRON) injection 8 mg  8 mg Intravenous Once Irving Copas, PA-C       lactated ringers infusion   Intravenous Continuous Irving Copas, PA-C       lactated ringers infusion   Intravenous Continuous Lidia Collum, MD       povidone-iodine 10 % swab   Topical Once Irving Copas, PA-C       tranexamic acid (CYKLOKAPRON) IVPB 1,000 mg  1,000 mg Intravenous To OR Irving Copas, PA-C       Allergies  Allergen Reactions   Latex Swelling    Social History   Tobacco Use   Smoking status: Never   Smokeless tobacco: Never  Substance Use Topics   Alcohol use: Never    History reviewed. No pertinent family history.   Review of Systems  Constitutional:  Negative for chills and fever.  Respiratory:  Negative for cough and shortness of breath.   Cardiovascular:  Negative for chest pain.  Gastrointestinal:  Negative for nausea and vomiting.  Musculoskeletal:  Positive for arthralgias.     Objective:  Physical Exam Well nourished and well developed. General: Alert and oriented x3, cooperative and pleasant, no acute distress. Head: normocephalic, atraumatic, neck supple. Eyes: EOMI.  Musculoskeletal: Right hip exam: I chose to defer moving her hip around based on the radiographic appearance as I knew that it would produce discomfort. She does not have significant lower extremity edema or erythema  Calves soft and  nontender. Motor function intact in LE. Strength 5/5 LE bilaterally. Neuro: Distal pulses 2+. Sensation to light touch intact in LE.  Vital signs in last 24 hours:    Labs:   Estimated body mass index is 34.33 kg/m as calculated from the following:   Height as of 03/29/22: '5\' 3"'$  (1.6 m).   Weight as of 03/29/22: 87.9 kg.   Imaging Review Plain radiographs demonstrate severe degenerative joint disease of the right hip(s). The bone quality appears to be adequate for age and reported activity level.      Assessment/Plan:  End stage arthritis, right hip(s)  The patient history, physical examination, clinical judgement of the provider and imaging studies are consistent with end stage degenerative joint disease of the right hip(s) and total hip arthroplasty is deemed medically necessary. The treatment options including medical management, injection therapy, arthroscopy and arthroplasty were discussed at length. The risks and benefits of total hip arthroplasty were presented and reviewed. The risks due to aseptic loosening, infection, stiffness, dislocation/subluxation,  thromboembolic complications and other imponderables were discussed.  The patient acknowledged the explanation, agreed to proceed with the plan and consent was signed. Patient is being admitted for inpatient treatment for surgery, pain control, PT, OT, prophylactic antibiotics, VTE prophylaxis, progressive ambulation and ADL's and discharge planning.The patient is planning to be discharged  home  Therapy Plans: HEP Disposition: Home with daughter & son Planned DVT Prophylaxis: aspirin '81mg'$  BID DME needed: none PCP: Dr. Tamala Julian, clearance received TXA: IV Allergies: latex - rash Anesthesia Concerns: none BMI: 34.7 Last HgbA1c: Not diabetic   Other: - hydrocodone, robaxin, - Hx of hiatal hernia - no NSAIDs    Costella Hatcher, PA-C Orthopedic Surgery [facility] [place] [phone]

## 2022-04-11 NOTE — Anesthesia Procedure Notes (Signed)
Date/Time: 04/11/2022 8:50 AM  Performed by: Sharlette Dense, CRNAOxygen Delivery Method: Simple face mask

## 2022-04-11 NOTE — Transfer of Care (Signed)
Immediate Anesthesia Transfer of Care Note  Patient: Danielle Sexton  Procedure(s) Performed: TOTAL HIP ARTHROPLASTY ANTERIOR APPROACH (Right: Hip)  Patient Location: PACU  Anesthesia Type:Spinal  Level of Consciousness: awake, alert  and oriented  Airway & Oxygen Therapy: Patient Spontanous Breathing and Patient connected to face mask oxygen  Post-op Assessment: Report given to RN and Post -op Vital signs reviewed and stable  Post vital signs: Reviewed and stable  Last Vitals:  Vitals Value Taken Time  BP 122/67 04/11/22 1045  Temp    Pulse 81 04/11/22 1046  Resp 14 04/11/22 1046  SpO2 100 % 04/11/22 1046  Vitals shown include unvalidated device data.  Last Pain:  Vitals:   04/11/22 0651  TempSrc:   PainSc: 5       Patients Stated Pain Goal: 4 (25/85/27 7824)  Complications: No notable events documented.

## 2022-04-11 NOTE — Evaluation (Signed)
Physical Therapy Evaluation Patient Details Name: Danielle Sexton MRN: [id number] DOB: 1946-07-03 Today's Date: 04/11/2022  History of Present Illness  76 yo female S/P right THA through direct Anterior approach. PMH: HTN,heart murmur, GERD, anxiety  Clinical Impression  The patient is eager to get OOB. Patient able to mobilize to sitting. Ambulated x 20' with RW/ Patient should progress to DC hoe with family support. Pt admitted with above diagnosis.  Pt currently with functional limitations due to the deficits listed below (see PT Problem List). Pt will benefit from skilled PT to increase their independence and safety with mobility to allow discharge to the venue listed below.          Recommendations for follow up therapy are one component of a multi-disciplinary discharge planning process, led by the attending physician.  Recommendations may be updated based on patient status, additional functional criteria and insurance authorization.  Follow Up Recommendations Follow physician's recommendations for discharge plan and follow up therapies      Assistance Recommended at Discharge Intermittent Supervision/Assistance  Patient can return home with the following  A little help with walking and/or transfers;Assistance with cooking/housework;Help with stairs or ramp for entrance;A little help with bathing/dressing/bathroom    Equipment Recommendations None recommended by PT  Recommendations for Other Services       Functional Status Assessment Patient has had a recent decline in their functional status and demonstrates the ability to make significant improvements in function in a reasonable and predictable amount of time.     Precautions / Restrictions Precautions Precautions: Fall Restrictions Weight Bearing Restrictions: No RLE Weight Bearing: Weight bearing as tolerated      Mobility  Bed Mobility Overal bed mobility: Needs Assistance Bed Mobility: Supine to Sit      Supine to sit: HOB elevated, Min guard     General bed mobility comments: no assistance  required    Transfers Overall transfer level: Needs assistance Equipment used: Rolling walker (2 wheels) Transfers: Sit to/from Stand Sit to Stand: Min assist           General transfer comment: cues for hand  placement    Ambulation/Gait Ambulation/Gait assistance: Min assist Gait Distance (Feet): 20 Feet Assistive device: Rolling walker (2 wheels) Gait Pattern/deviations: Step-to pattern, Step-through pattern, Antalgic       General Gait Details: cues for sequence  Stairs            Wheelchair Mobility    Modified Rankin (Stroke Patients Only)       Balance Overall balance assessment: Mild deficits observed, not formally tested                                           Pertinent Vitals/Pain Pain Assessment Pain Assessment: 0-10 Pain Score: 3  Pain Location: right hip Pain Descriptors / Indicators: Aching, Grimacing Pain Intervention(s): Monitored during session, Premedicated before session, Ice applied    Home Living Family/patient expects to be discharged to:: Private residence Living Arrangements: Alone Available Help at Discharge: Family;Available 24 hours/day Type of Home: Apartment Home Access: Level entry       Home Layout: One level Home Equipment: Conservation officer, nature (2 wheels)      Prior Function                       Hand Dominance   Dominant Hand: Right  Extremity/Trunk Assessment   Upper Extremity Assessment Upper Extremity Assessment: Overall WFL for tasks assessed    Lower Extremity Assessment Lower Extremity Assessment: RLE deficits/detail RLE Deficits / Details: able to flex hip and kne, WB for ambulation       Communication   Communication: No difficulties  Cognition Arousal/Alertness: Awake/alert Behavior During Therapy: WFL for tasks assessed/performed Overall Cognitive Status: Within  Functional Limits for tasks assessed                                          General Comments      Exercises     Assessment/Plan    PT Assessment Patient needs continued PT services  PT Problem List Decreased strength;Decreased mobility;Decreased safety awareness;Decreased range of motion;Decreased knowledge of precautions;Decreased activity tolerance;Decreased knowledge of use of DME;Pain       PT Treatment Interventions DME instruction;Therapeutic activities;Gait training;Therapeutic exercise;Patient/family education;Functional mobility training    PT Goals (Current goals can be found in the Care Plan section)  Acute Rehab PT Goals Patient Stated Goal: to go home PT Goal Formulation: With patient/family Time For Goal Achievement: 04/18/22 Potential to Achieve Goals: Good    Frequency 7X/week     Co-evaluation               AM-PAC PT "6 Clicks" Mobility  Outcome Measure Help needed turning from your back to your side while in a flat bed without using bedrails?: A Little Help needed moving from lying on your back to sitting on the side of a flat bed without using bedrails?: A Little Help needed moving to and from a bed to a chair (including a wheelchair)?: A Little Help needed standing up from a chair using your arms (e.g., wheelchair or bedside chair)?: A Little Help needed to walk in hospital room?: A Little Help needed climbing 3-5 steps with a railing? : A Lot 6 Click Score: 17    End of Session Equipment Utilized During Treatment: Gait belt Activity Tolerance: Patient tolerated treatment well Patient left: in chair;with call bell/phone within reach;with chair alarm set;with family/visitor present Nurse Communication: Mobility status PT Visit Diagnosis: Unsteadiness on feet (R26.81);Muscle weakness (generalized) (M62.81);Difficulty in walking, not elsewhere classified (R26.2)    Time: 1448-1856 PT Time Calculation (min) (ACUTE ONLY): 24  min   Charges:   PT Evaluation $PT Eval Low Complexity: 1 Low PT Treatments $Gait Training: 8-22 mins        [place] Office [phone] Weekend [phone]   Claretha Cooper 04/11/2022, 4:15 PM

## 2022-04-11 NOTE — Op Note (Signed)
NAME:  Danielle Sexton                ACCOUNT NO.: [id number]      MEDICAL RECORD NO.: [id number]      FACILITY:  [facility]      PHYSICIAN:  Mauri Pole  DATE OF BIRTH:  01-23-1946     DATE OF PROCEDURE:  04/11/2022                                 OPERATIVE REPORT         PREOPERATIVE DIAGNOSIS: Right  hip osteoarthritis.      POSTOPERATIVE DIAGNOSIS:  Right hip osteoarthritis.      PROCEDURE:  Right total hip replacement through an anterior approach   utilizing DePuy THR system, component size 52 mm pinnacle cup, a size 36+4 neutral   Altrex liner, a size 3 Hi Actis stem with a 36+1.5 Articuleze metal ball.      SURGEON:  Pietro Cassis. Alvan Dame, M.D.      ASSISTANT:  Costella Hatcher, PA-C     ANESTHESIA:  Spinal.      SPECIMENS:  None.      COMPLICATIONS:  None.      BLOOD LOSS:  850 cc     DRAINS:  None.      INDICATION OF THE PROCEDURE:  Danielle Sexton is a 76 y.o. female who had   presented to office for evaluation of right hip pain.  Radiographs revealed   progressive degenerative changes with bone-on-bone   articulation of the  hip joint, including subchondral cystic changes and osteophytes.  The patient had painful limited range of   motion significantly affecting their overall quality of life and function.  The patient was failing to    respond to conservative measures including medications and/or injections and activity modification and at this point was ready   to proceed with more definitive measures.  Consent was obtained for   benefit of pain relief.  Specific risks of infection, DVT, component   failure, dislocation, neurovascular injury, and need for revision surgery were reviewed in the office as well discussion of   the anterior versus posterior approach were reviewed.     PROCEDURE IN DETAIL:  The patient was brought to operative theater.   Once adequate anesthesia, preoperative antibiotics, 2 gm of Ancef, 1 gm of Tranexamic Acid,  and 10 mg of Decadron were administered, the patient was positioned supine on the [employer] table.  Once the patient was safely positioned with adequate padding of boney prominences we predraped out the hip, and used fluoroscopy to confirm orientation of the pelvis.      The right hip was then prepped and draped from proximal iliac crest to   mid thigh with a shower curtain technique.      Time-out was performed identifying the patient, planned procedure, and the appropriate extremity.     An incision was then made 2 cm lateral to the   anterior superior iliac spine extending over the orientation of the   tensor fascia lata muscle and sharp dissection was carried down to the   fascia of the muscle.      The fascia was then incised.  The muscle belly was identified and swept   laterally and retractor placed along the superior neck.  Following   cauterization of the circumflex vessels and removing some pericapsular  fat, a second cobra retractor was placed on the inferior neck.  A T-capsulotomy was made along the line of the   superior neck to the trochanteric fossa, then extended proximally and   distally.  Tag sutures were placed and the retractors were then placed   intracapsular.  We then identified the trochanteric fossa and   orientation of my neck cut and then made a neck osteotomy with the femur on traction.  The femoral   head was removed without difficulty or complication.  Traction was let   off and retractors were placed posterior and anterior around the   acetabulum.      The labrum and foveal tissue were debrided.  I began reaming with a 45 mm   reamer and reamed up to 51 mm reamer with good bony bed preparation and a 52 mm  cup was chosen.  The final 52 mm Pinnacle cup was then impacted under fluoroscopy to confirm the depth of penetration and orientation with respect to   Abduction and forward flexion.  A screw was placed into the ilium followed by the hole eliminator.  The  final   36+4 neutral Altrex liner was impacted with good visualized rim fit.  The cup was positioned anatomically within the acetabular portion of the pelvis.      At this point, the femur was rolled to 100 degrees.  Further capsule was   released off the inferior aspect of the femoral neck.  I then   released the superior capsule proximally.  With the leg in a neutral position the hook was placed laterally   along the femur under the vastus lateralis origin and elevated manually and then held in position using the hook attachment on the bed.  The leg was then extended and adducted with the leg rolled to 100   degrees of external rotation.  Retractors were placed along the medial calcar and posteriorly over the greater trochanter.  Once the proximal femur was fully   exposed, I used a box osteotome to set orientation.  I then began   broaching with the starting chili pepper broach and passed this by hand and then broached up to 3.  With the 3 broach in place I chose a high offset neck and did several trial reductions.  The offset was appropriate, leg lengths   appeared to be equal best matched with the +1.5 head ball trial confirmed radiographically.   Given these findings, I went ahead and dislocated the hip, repositioned all   retractors and positioned the right hip in the extended and abducted position.  The final 3 Hi Actis stem was   chosen and it was impacted down to the level of neck cut.  Based on this   and the trial reductions, a final 36+1.5 Articuleze metal head ball was chosen and   impacted onto a clean and dry trunnion, and the hip was reduced.  The   hip had been irrigated throughout the case again at this point.  I did   reapproximate the superior capsular leaflet to the anterior leaflet   using #1 Vicryl.  The fascia of the   tensor fascia lata muscle was then reapproximated using #1 Vicryl and #0 Stratafix sutures.  The   remaining wound was closed with 2-0 Vicryl and running  4-0 Monocryl.   The hip was cleaned, dried, and dressed sterilely using Dermabond and   Aquacel dressing.  The patient was then brought   to recovery room  in stable condition tolerating the procedure well.    Ashely Lu Duffel, PA-C was present for the entirety of the case involved from   preoperative positioning, perioperative retractor management, general   facilitation of the case, as well as primary wound closure as assistant.            Pietro Cassis Alvan Dame, M.D.        04/11/2022 10:24 AM

## 2022-04-11 NOTE — Plan of Care (Signed)
  Problem: Clinical Measurements: Goal: Diagnostic test results will improve Outcome: Progressing   Problem: Clinical Measurements: Goal: Respiratory complications will improve Outcome: Progressing   Problem: Clinical Measurements: Goal: Cardiovascular complication will be avoided Outcome: Progressing   Problem: Nutrition: Goal: Adequate nutrition will be maintained Outcome: Progressing   Problem: Coping: Goal: Level of anxiety will decrease Outcome: Progressing   Problem: Elimination: Goal: Will not experience complications related to bowel motility Outcome: Progressing

## 2022-04-12 ENCOUNTER — Encounter (HOSPITAL_COMMUNITY): Payer: Self-pay | Admitting: Orthopedic Surgery

## 2022-04-12 DIAGNOSIS — I10 Essential (primary) hypertension: Secondary | ICD-10-CM | POA: Diagnosis not present

## 2022-04-12 DIAGNOSIS — M1611 Unilateral primary osteoarthritis, right hip: Secondary | ICD-10-CM | POA: Diagnosis not present

## 2022-04-12 DIAGNOSIS — Z91040 Latex allergy status: Secondary | ICD-10-CM | POA: Diagnosis not present

## 2022-04-12 LAB — CBC
HCT: 26.8 % — ABNORMAL LOW (ref 36.0–46.0)
Hemoglobin: 9 g/dL — ABNORMAL LOW (ref 12.0–15.0)
MCH: 31.8 pg (ref 26.0–34.0)
MCHC: 33.6 g/dL (ref 30.0–36.0)
MCV: 94.7 fL (ref 80.0–100.0)
Platelets: 137 10*3/uL — ABNORMAL LOW (ref 150–400)
RBC: 2.83 MIL/uL — ABNORMAL LOW (ref 3.87–5.11)
RDW: 12.3 % (ref 11.5–15.5)
WBC: 11.7 10*3/uL — ABNORMAL HIGH (ref 4.0–10.5)
nRBC: 0 % (ref 0.0–0.2)

## 2022-04-12 LAB — BASIC METABOLIC PANEL
Anion gap: 5 (ref 5–15)
BUN: 12 mg/dL (ref 8–23)
CO2: 25 mmol/L (ref 22–32)
Calcium: 9.1 mg/dL (ref 8.9–10.3)
Chloride: 108 mmol/L (ref 98–111)
Creatinine, Ser: 0.55 mg/dL (ref 0.44–1.00)
GFR, Estimated: >60 mL/min (ref >60)
Glucose, Bld: 139 mg/dL — ABNORMAL HIGH (ref 70–99)
Potassium: 3.5 mmol/L (ref 3.5–5.1)
Sodium: 138 mmol/L (ref 135–145)

## 2022-04-12 MED ORDER — POLYETHYLENE GLYCOL 3350 17 G PO PACK
17.0000 g | PACK | Freq: Every day | ORAL | 0 refills | Status: DC | PRN
Start: 2022-04-12 — End: 2023-06-10

## 2022-04-12 MED ORDER — DOCUSATE SODIUM 100 MG PO CAPS: 100.0000 mg | ORAL_CAPSULE | Freq: Two times a day (BID) | ORAL | 0 refills | Status: AC

## 2022-04-12 MED ORDER — POTASSIUM CHLORIDE CRYS ER 20 MEQ PO TBCR
40.0000 meq | EXTENDED_RELEASE_TABLET | Freq: Once | ORAL | Status: AC
Start: 2022-04-12 — End: 2022-04-12
  Administered 2022-04-12: 40 meq via ORAL
  Filled 2022-04-12: qty 2

## 2022-04-12 MED ORDER — HYDROCODONE-ACETAMINOPHEN 5-325 MG PO TABS: 1.0000 | ORAL_TABLET | ORAL | 0 refills | Status: DC | PRN

## 2022-04-12 MED ORDER — ASPIRIN 81 MG PO CHEW
81.0000 mg | CHEWABLE_TABLET | Freq: Two times a day (BID) | ORAL | 0 refills | Status: AC
Start: 2022-04-12 — End: 2022-05-10

## 2022-04-12 MED ORDER — METHOCARBAMOL 500 MG PO TABS: 500.0000 mg | ORAL_TABLET | Freq: Four times a day (QID) | ORAL | 0 refills | Status: DC | PRN

## 2022-04-12 NOTE — Care Plan (Signed)
Ortho Bundle Case Management Note  Patient Details  Name: Danielle Sexton MRN: [id number] Date of Birth: 05-21-1946  R THA on 04-11-22 DCP: Home with dtr and son DME:  No needs, has a RW PT:  HEP       DME Arranged:  N/A DME Agency:  NA  HH Arranged:  NA [place] Agency:  NA  Additional Comments: Please contact me with any questions of if this plan should need to change.  Marianne Sofia, RN,CCM [facility]  [phone] 04/12/2022, 10:30 AM

## 2022-04-12 NOTE — TOC Transition Note (Signed)
Transition of Care [facility]) - CM/SW Discharge Note  Patient Details  Name: Danielle Sexton MRN: [id number] Date of Birth: July 31, 1946  Transition of Care [facility]) CM/SW Contact:  Sherie Don, LCSW Phone Number: 04/12/2022, 10:38 AM  Clinical Narrative: Patient will go home after working with PT. CSW met with patient and her son to confirm discharge plan. Patient will home with a home exercise program (HEP). Patient has a rolling walker and cane at home, so there are no DME needs at this time. TOC signing off.  Final next level of care: Home/Self Care Barriers to Discharge: No Barriers Identified  Patient Goals and CMS Choice Patient states their goals for this hospitalization and ongoing recovery are:: Discharge home with HEP Choice offered to / list presented to : NA  Discharge Plan and Services        DME Arranged: N/A DME Agency: NA HH Arranged: NA [place] Agency: NA  Readmission Risk Interventions     No data to display

## 2022-04-12 NOTE — Progress Notes (Signed)
   Subjective: 1 Day Post-Op Procedure(s) (LRB): TOTAL HIP ARTHROPLASTY ANTERIOR APPROACH (Right) Patient reports pain as mild.   Patient seen in rounds for Dr. Alvan Dame. Patient is sitting up in the recliner this morning on exam with her son at the bedside. No acute events overnight. Denies CP, SHOB. Foley catheter removed, voiding without difficulty. Ambulated 20 feet with PT.  We will continue therapy today.   Objective: Vital signs in last 24 hours: Temp:  [98.1 F (36.7 C)-98.9 F (37.2 C)] 98.5 F (36.9 C) (06/30 0552) Pulse Rate:  [72-89] 86 (06/30 0552) Resp:  [14-18] 18 (06/30 0552) BP: (122-163)/(59-87) 157/74 (06/30 0552) SpO2:  [97 %-100 %] 100 % (06/30 0552)  Intake/Output from previous day:  Intake/Output Summary (Last 24 hours) at 04/12/2022 0749 Last data filed at 04/12/2022 0557 Gross per 24 hour  Intake 4383.95 ml  Output 4550 ml  Net -166.05 ml     Intake/Output this shift: No intake/output data recorded.  Labs: Recent Labs    04/12/22 0320  HGB 9.0*   Recent Labs    04/12/22 0320  WBC 11.7*  RBC 2.83*  HCT 26.8*  PLT 137*   Recent Labs    04/12/22 0320  NA 138  K 3.5  CL 108  CO2 25  BUN 12  CREATININE 0.55  GLUCOSE 139*  CALCIUM 9.1   No results for input(s): "LABPT", "INR" in the last 72 hours.  Exam: General - Patient is Alert and Oriented Extremity - Neurologically intact Sensation intact distally Intact pulses distally Dorsiflexion/Plantar flexion intact Dressing - dressing C/D/I Motor Function - intact, moving foot and toes well on exam.   Past Medical History:  Diagnosis Date   Achilles tendinitis of left lower extremity 11/25/2021   Allergic rhinitis 11/25/2021   Anxiety    GERD (gastroesophageal reflux disease)    Heart murmur    History of hiatal hernia    HTN (hypertension) 11/25/2021   Hyperlipidemia 11/25/2021   OA (osteoarthritis) of hip 11/25/2021   OA (osteoarthritis) of knee 11/25/2021     Assessment/Plan: 1 Day Post-Op Procedure(s) (LRB): TOTAL HIP ARTHROPLASTY ANTERIOR APPROACH (Right) Principal Problem:   S/P total right hip arthroplasty  Estimated body mass index is 34.33 kg/m as calculated from the following:   Height as of this encounter: '5\' 3"'$  (1.6 m).   Weight as of this encounter: 87.9 kg. Advance diet Up with therapy D/C IV fluids  DVT Prophylaxis - Aspirin Weight bearing as tolerated.  Hgb stable at 9 this AM.  K 3.5, will give one dose of kdur.   Plan is to go Home after hospital stay. Plan for discharge today after meeting goals with therapy. Follow up in the office in 2 weeks.   Griffith Citron, PA-C Orthopedic Surgery [phone] 04/12/2022, 7:49 AM

## 2022-04-13 ENCOUNTER — Encounter (HOSPITAL_COMMUNITY): Payer: Self-pay | Admitting: Orthopedic Surgery

## 2022-04-13 NOTE — Anesthesia Postprocedure Evaluation (Signed)
Anesthesia Post Note  Patient: Danielle Sexton  Procedure(s) Performed: TOTAL HIP ARTHROPLASTY ANTERIOR APPROACH (Right: Hip)     Patient location during evaluation: PACU Anesthesia Type: MAC and Spinal Level of consciousness: oriented and awake and alert Pain management: pain level controlled Vital Signs Assessment: post-procedure vital signs reviewed and stable Respiratory status: spontaneous breathing and respiratory function stable Cardiovascular status: blood pressure returned to baseline and stable Postop Assessment: no headache, no backache, no apparent nausea or vomiting and patient able to bend at knees Anesthetic complications: no   No notable events documented.  Last Vitals:  Vitals:   04/12/22 0142 04/12/22 0552  BP: (!) 163/61 (!) 157/74  Pulse: 83 86  Resp: 18 18  Temp: 36.7 C 36.9 C  SpO2: 99% 100%    Last Pain:  Vitals:   04/12/22 0733  TempSrc:   PainSc: 2    Pain Goal: Patients Stated Pain Goal: 2 (04/12/22 0647)                 Merlinda Frederick

## 2022-04-17 NOTE — Discharge Summary (Signed)
Patient ID: Danielle Sexton MRN: [id number] DOB/AGE: 77-Jul-1947 77 y.o.  Admit date: 04/11/2022 Discharge date: 04/12/2022  Admission Diagnoses:  Right hip osteoarthritis  Discharge Diagnoses:  Principal Problem:   S/P total right hip arthroplasty   Past Medical History:  Diagnosis Date   Achilles tendinitis of left lower extremity 11/25/2021   Allergic rhinitis 11/25/2021   Anxiety    GERD (gastroesophageal reflux disease)    Heart murmur    History of hiatal hernia    HTN (hypertension) 11/25/2021   Hyperlipidemia 11/25/2021   OA (osteoarthritis) of hip 11/25/2021   OA (osteoarthritis) of knee 11/25/2021    Surgeries: Procedure(s): TOTAL HIP ARTHROPLASTY ANTERIOR APPROACH on 04/11/2022   Consultants:   Discharged Condition: Improved  Hospital Course: Danielle Sexton is an 76 y.o. female who was admitted 04/11/2022 for operative treatment ofS/P total right hip arthroplasty. Patient has severe unremitting pain that affects sleep, daily activities, and work/hobbies. After pre-op clearance the patient was taken to the operating room on 04/11/2022 and underwent  Procedure(s): TOTAL HIP ARTHROPLASTY ANTERIOR APPROACH.    Patient was given perioperative antibiotics:  Anti-infectives (From admission, onward)    Start     Dose/Rate Route Frequency Ordered Stop   04/11/22 1500  ceFAZolin (ANCEF) IVPB 2g/100 mL premix        2 g 200 mL/hr over 30 Minutes Intravenous Every 6 hours 04/11/22 1201 04/11/22 2147   04/11/22 0630  ceFAZolin (ANCEF) IVPB 2g/100 mL premix        2 g 200 mL/hr over 30 Minutes Intravenous On call to O.R. 04/11/22 2725 04/11/22 0913        Patient was given sequential compression devices, early ambulation, and chemoprophylaxis to prevent DVT. Patient worked with PT and was meeting their goals regarding safe ambulation and transfers.  Patient benefited maximally from hospital stay and there were no complications.    Recent vital signs: No data  found.   Recent laboratory studies: No results for input(s): "WBC", "HGB", "HCT", "PLT", "NA", "K", "CL", "CO2", "BUN", "CREATININE", "GLUCOSE", "INR", "CALCIUM" in the last 72 hours.  Invalid input(s): "PT", "2"   Discharge Medications:   Allergies as of 04/12/2022       Reactions   Latex Swelling        Medication List     STOP taking these medications    acetaminophen 650 MG CR tablet Commonly known as: TYLENOL   ALPRAZolam 0.25 MG tablet Commonly known as: XANAX       TAKE these medications    alum hydroxide-mag trisilicate 36-64 MG Chew chewable tablet Commonly known as: GAVISCON Chew 2 tablets by mouth 3 (three) times daily as needed for indigestion or heartburn.   amLODipine 10 MG tablet Commonly known as: NORVASC Take 10 mg by mouth daily.   aspirin 81 MG chewable tablet Chew 1 tablet (81 mg total) by mouth 2 (two) times daily for 28 days.   Co Q-10 100 MG Caps Take 100 mg by mouth daily.   diclofenac Sodium 1 % Gel Commonly known as: VOLTAREN Apply 2 g topically daily as needed (arthritis pain).   docusate sodium 100 MG capsule Commonly known as: COLACE Take 1 capsule (100 mg total) by mouth 2 (two) times daily.   Ferrous Sulfate Dried 45 MG Tbcr Take 45 mg by mouth daily.   fexofenadine 180 MG tablet Commonly known as: ALLEGRA Take 180 mg by mouth daily.   fluticasone 50 MCG/ACT nasal spray Commonly known as: FLONASE Place 1  spray into both nostrils daily as needed for allergies.   hydrALAZINE 50 MG tablet Commonly known as: APRESOLINE Take 50 mg by mouth 2 (two) times daily.   HYDROcodone-acetaminophen 5-325 MG tablet Commonly known as: NORCO/VICODIN Take 1 tablet by mouth every 4 (four) hours as needed for moderate pain or severe pain.   meclizine 25 MG tablet Commonly known as: ANTIVERT Take 25 mg by mouth 3 (three) times daily as needed for dizziness.   methocarbamol 500 MG tablet Commonly known as: ROBAXIN Take 1 tablet (500  mg total) by mouth every 6 (six) hours as needed for muscle spasms (muscle pain).   montelukast 10 MG tablet Commonly known as: SINGULAIR Take 10 mg by mouth daily as needed (allergies).   multivitamin with minerals Tabs tablet Take 1 tablet by mouth daily.   polyethylene glycol 17 g packet Commonly known as: MIRALAX / GLYCOLAX Take 17 g by mouth daily as needed for mild constipation.   polyethylene glycol-electrolytes 420 g solution Commonly known as: NuLYTELY See admin instructions.   telmisartan 80 MG tablet Commonly known as: MICARDIS Take 80 mg by mouth daily.               Discharge Care Instructions  (From admission, onward)           Start     Ordered   04/12/22 0000  Change dressing       Comments: Maintain surgical dressing until follow up in the clinic. If the edges start to pull up, may reinforce with tape. If the dressing is no longer working, may remove and cover with gauze and tape, but must keep the area dry and clean.  Call with any questions or concerns.   04/12/22 0754            Diagnostic Studies: DG Pelvis Portable  Result Date: 04/11/2022 CLINICAL DATA:  Status post right hip arthroplasty EXAM: PORTABLE PELVIS 1-2 VIEWS COMPARISON:  None Available. FINDINGS: There is evidence of recent right hip arthroplasty. There are pockets of air in the soft tissues. IMPRESSION: Status post right hip arthroplasty. Electronically Signed   By: Elmer Picker M.D.   On: 04/11/2022 11:09   DG HIP UNILAT WITH PELVIS 1V RIGHT  Result Date: 04/11/2022 CLINICAL DATA:  Right hip replacement EXAM: DG HIP (WITH OR WITHOUT PELVIS) 1V RIGHT COMPARISON:  None Available. FINDINGS: Intraoperative images during right total hip arthroplasty. Normal alignment. IMPRESSION: Intraoperative images during right total hip arthroplasty. Normal alignment. Electronically Signed   By: Maurine Simmering M.D.   On: 04/11/2022 10:26   DG C-Arm 1-60 Min-No Report  Result Date:  04/11/2022 Fluoroscopy was utilized by the requesting physician.  No radiographic interpretation.   DG C-Arm 1-60 Min-No Report  Result Date: 04/11/2022 Fluoroscopy was utilized by the requesting physician.  No radiographic interpretation.    Disposition: Discharge disposition: 01-Home or Self Care       Discharge Instructions     Call MD / Call 911   Complete by: As directed    If you experience chest pain or shortness of breath, CALL 911 and be transported to the hospital emergency room.  If you develope a fever above 101 F, pus (white drainage) or increased drainage or redness at the wound, or calf pain, call your surgeon's office.   Change dressing   Complete by: As directed    Maintain surgical dressing until follow up in the clinic. If the edges start to pull up, may reinforce with tape.  If the dressing is no longer working, may remove and cover with gauze and tape, but must keep the area dry and clean.  Call with any questions or concerns.   Constipation Prevention   Complete by: As directed    Drink plenty of fluids.  Prune juice may be helpful.  You may use a stool softener, such as Colace (over the counter) 100 mg twice a day.  Use MiraLax (over the counter) for constipation as needed.   Diet - low sodium heart healthy   Complete by: As directed    Increase activity slowly as tolerated   Complete by: As directed    Weight bearing as tolerated with assist device (walker, cane, etc) as directed, use it as long as suggested by your surgeon or therapist, typically at least 4-6 weeks.   Post-operative opioid taper instructions:   Complete by: As directed    POST-OPERATIVE OPIOID TAPER INSTRUCTIONS: It is important to wean off of your opioid medication as soon as possible. If you do not need pain medication after your surgery it is ok to stop day one. Opioids include: Codeine, Hydrocodone(Norco, Vicodin), Oxycodone(Percocet, oxycontin) and hydromorphone amongst others.  Long  term and even short term use of opiods can cause: Increased pain response Dependence Constipation Depression Respiratory depression And more.  Withdrawal symptoms can include Flu like symptoms Nausea, vomiting And more Techniques to manage these symptoms Hydrate well Eat regular healthy meals Stay active Use relaxation techniques(deep breathing, meditating, yoga) Do Not substitute Alcohol to help with tapering If you have been on opioids for less than two weeks and do not have pain than it is ok to stop all together.  Plan to wean off of opioids This plan should start within one week post op of your joint replacement. Maintain the same interval or time between taking each dose and first decrease the dose.  Cut the total daily intake of opioids by one tablet each day Next start to increase the time between doses. The last dose that should be eliminated is the evening dose.      TED hose   Complete by: As directed    Use stockings (TED hose) for 2 weeks on both leg(s).  You may remove them at night for sleeping.        Follow-up Information     Irving Copas, PA-C. Go on 05/01/2022.   Specialty: Orthopedic Surgery Why: You are scheduled for a follow up appointment on 05-01-22 at 9:45 am Contact information: [address] [place] [address] [phone]                  Signed: Irving Copas 04/17/2022, 9:27 AM

## 2022-05-21 ENCOUNTER — Encounter (HOSPITAL_COMMUNITY): Payer: Self-pay | Admitting: Gastroenterology

## 2022-05-24 DIAGNOSIS — Z5189 Encounter for other specified aftercare: Secondary | ICD-10-CM | POA: Diagnosis not present

## 2022-05-28 ENCOUNTER — Encounter (HOSPITAL_COMMUNITY): Admission: RE | Payer: Self-pay | Source: Home / Self Care

## 2022-05-28 ENCOUNTER — Ambulatory Visit (HOSPITAL_COMMUNITY): Admission: RE | Admit: 2022-05-28 | Payer: Medicare HMO | Source: Home / Self Care | Admitting: Gastroenterology

## 2022-05-28 SURGERY — EGD (ESOPHAGOGASTRODUODENOSCOPY)
Anesthesia: Monitor Anesthesia Care

## 2022-05-31 ENCOUNTER — Ambulatory Visit: Payer: Medicare HMO | Admitting: Podiatry

## 2022-05-31 ENCOUNTER — Encounter: Payer: Self-pay | Admitting: Podiatry

## 2022-05-31 DIAGNOSIS — G8929 Other chronic pain: Secondary | ICD-10-CM | POA: Insufficient documentation

## 2022-05-31 DIAGNOSIS — M79675 Pain in left toe(s): Secondary | ICD-10-CM | POA: Diagnosis not present

## 2022-05-31 DIAGNOSIS — M79674 Pain in right toe(s): Secondary | ICD-10-CM | POA: Diagnosis not present

## 2022-05-31 DIAGNOSIS — E7800 Pure hypercholesterolemia, unspecified: Secondary | ICD-10-CM | POA: Insufficient documentation

## 2022-05-31 DIAGNOSIS — Z860100 Personal history of colon polyps, unspecified: Secondary | ICD-10-CM | POA: Insufficient documentation

## 2022-05-31 DIAGNOSIS — B351 Tinea unguium: Secondary | ICD-10-CM

## 2022-05-31 DIAGNOSIS — Q396 Congenital diverticulum of esophagus: Secondary | ICD-10-CM | POA: Insufficient documentation

## 2022-05-31 DIAGNOSIS — E785 Hyperlipidemia, unspecified: Secondary | ICD-10-CM | POA: Insufficient documentation

## 2022-05-31 DIAGNOSIS — F4320 Adjustment disorder, unspecified: Secondary | ICD-10-CM | POA: Insufficient documentation

## 2022-05-31 DIAGNOSIS — R1310 Dysphagia, unspecified: Secondary | ICD-10-CM | POA: Insufficient documentation

## 2022-05-31 DIAGNOSIS — K5730 Diverticulosis of large intestine without perforation or abscess without bleeding: Secondary | ICD-10-CM | POA: Insufficient documentation

## 2022-06-09 NOTE — Progress Notes (Signed)
  Subjective:  Patient ID: Danielle Sexton, female    DOB: March 30, 1946,  MRN: [id number]  Danielle Sexton presents to clinic today for painful elongated mycotic toenails 1-5 bilaterally which are tender when wearing enclosed shoe gear. Pain is relieved with periodic professional debridement.  New problem(s): None.   PCP is Carol Ada, MD , and last visit was Feb 18, 2022.  Allergies  Allergen Reactions   Latex Swelling   Review of Systems: Negative except as noted in the HPI.  Objective:   Vascular Examination: Vascular status intact b/l with palpable pedal pulses. Pedal hair sparse b/l. CFT immediate b/l. No edema. No pain with calf compression b/l. Skin temperature gradient WNL b/l. No cyanosis or clubbing noted b/l LE.  Neurological Examination: Sensation grossly intact b/l with 10 gram monofilament. Vibratory sensation intact b/l.   Dermatological Examination: Pedal skin with normal turgor, texture and tone b/l. Toenails 1-5 b/l thick, discolored, elongated with subungual debris and pain on dorsal palpation. No hyperkeratotic nor porokeratotic lesions present on today's visit.  Musculoskeletal Examination: Muscle strength 5/5 to b/l LE. 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  Radiographs: None   Assessment:   1. Pain due to onychomycosis of toenails of both feet    Plan:  Patient was evaluated and treated and all questions answered. Consent given for treatment as described below: -Examined patient. -Patient to continue soft, supportive shoe gear daily. -Mycotic toenails 1-5 bilaterally were debrided in length and girth with sterile nail nippers and dremel without incident. -Patient/POA to call should there be question/concern in the interim.  Return in about 3 months (around 08/31/2022).  Marzetta Board, DPM

## 2022-07-08 DIAGNOSIS — Z96641 Presence of right artificial hip joint: Secondary | ICD-10-CM | POA: Diagnosis not present

## 2022-07-08 DIAGNOSIS — M170 Bilateral primary osteoarthritis of knee: Secondary | ICD-10-CM | POA: Diagnosis not present

## 2022-07-30 DIAGNOSIS — R6889 Other general symptoms and signs: Secondary | ICD-10-CM | POA: Diagnosis not present

## 2022-08-22 DIAGNOSIS — I10 Essential (primary) hypertension: Secondary | ICD-10-CM | POA: Diagnosis not present

## 2022-08-22 DIAGNOSIS — E7800 Pure hypercholesterolemia, unspecified: Secondary | ICD-10-CM | POA: Diagnosis not present

## 2022-08-22 DIAGNOSIS — M1710 Unilateral primary osteoarthritis, unspecified knee: Secondary | ICD-10-CM | POA: Diagnosis not present

## 2022-08-22 DIAGNOSIS — F419 Anxiety disorder, unspecified: Secondary | ICD-10-CM | POA: Diagnosis not present

## 2022-09-03 ENCOUNTER — Ambulatory Visit: Payer: Medicare HMO | Admitting: Podiatry

## 2022-11-28 ENCOUNTER — Other Ambulatory Visit: Payer: Self-pay | Admitting: Family

## 2022-11-28 DIAGNOSIS — Z1231 Encounter for screening mammogram for malignant neoplasm of breast: Secondary | ICD-10-CM

## 2022-12-04 ENCOUNTER — Encounter: Payer: Self-pay | Admitting: Family

## 2022-12-05 ENCOUNTER — Other Ambulatory Visit: Payer: Self-pay | Admitting: Family

## 2022-12-05 DIAGNOSIS — Z13820 Encounter for screening for osteoporosis: Secondary | ICD-10-CM

## 2022-12-11 ENCOUNTER — Ambulatory Visit
Admission: RE | Admit: 2022-12-11 | Discharge: 2022-12-11 | Disposition: A | Discharge status: Home / Self Care | Payer: Medicare HMO | Source: Ambulatory Visit | Attending: Family | Admitting: Family

## 2022-12-11 DIAGNOSIS — Z1231 Encounter for screening mammogram for malignant neoplasm of breast: Secondary | ICD-10-CM

## 2022-12-13 ENCOUNTER — Other Ambulatory Visit: Payer: Self-pay | Admitting: Family

## 2022-12-13 DIAGNOSIS — R928 Other abnormal and inconclusive findings on diagnostic imaging of breast: Secondary | ICD-10-CM

## 2022-12-16 ENCOUNTER — Ambulatory Visit: Payer: Medicare HMO | Admitting: Podiatry

## 2022-12-16 VITALS — BP 190/86

## 2022-12-16 DIAGNOSIS — B351 Tinea unguium: Secondary | ICD-10-CM

## 2022-12-16 DIAGNOSIS — M79675 Pain in left toe(s): Secondary | ICD-10-CM | POA: Diagnosis not present

## 2022-12-16 DIAGNOSIS — M79674 Pain in right toe(s): Secondary | ICD-10-CM | POA: Diagnosis not present

## 2022-12-16 NOTE — Progress Notes (Unsigned)
  Subjective:  Patient ID: Danielle Sexton, female    DOB: [birth year],  MRN: [id number]  Danielle Sexton presents to clinic today for {jgcomplaint:23593}  Chief Complaint  Patient presents with   Nail Problem    [facility] PCP-Daye, [place] PCP VST-2 weeks ago   New problem(s): None. {jgcomplaint:23593}  PCP is Loretha Brasil, FNP.  Allergies  Allergen Reactions   Latex Swelling    Review of Systems: Negative except as noted in the HPI.  Objective: No changes noted in today's physical examination. Vitals:   12/16/22 0913  BP: (!) 190/86   Danielle Sexton is a pleasant 77 y.o. female {jgbodyhabitus:24098} AAO x 3.  Vascular Examination: Vascular status intact b/l with palpable pedal pulses. Pedal hair sparse b/l. CFT immediate b/l. No edema. No pain with calf compression b/l. Skin temperature gradient WNL b/l. No cyanosis or clubbing noted b/l LE.  Neurological Examination: Sensation grossly intact b/l with 10 gram monofilament. Vibratory sensation intact b/l.   Dermatological Examination: Pedal skin with normal turgor, texture and tone b/l. Toenails 1-5 b/l thick, discolored, elongated with subungual debris and pain on dorsal palpation. No hyperkeratotic nor porokeratotic lesions present on today's visit.  Musculoskeletal Examination: Muscle strength 5/5 to b/l LE. 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  Radiographs: None  Assessment/Plan: 1. Pain due to onychomycosis of toenails of both feet     No orders of the defined types were placed in this encounter.   None {Jgplan:23602::"-Patient/POA to call should there be question/concern in the interim."}   Return in about 3 months (around 03/18/2023).  Marzetta Board, DPM

## 2022-12-17 ENCOUNTER — Encounter: Payer: Self-pay | Admitting: Podiatry

## 2022-12-27 ENCOUNTER — Encounter: Payer: Self-pay | Admitting: Family

## 2022-12-31 ENCOUNTER — Ambulatory Visit
Admission: RE | Admit: 2022-12-31 | Discharge: 2022-12-31 | Disposition: A | Discharge status: Home / Self Care | Payer: Medicare HMO | Source: Ambulatory Visit | Attending: Family | Admitting: Family

## 2022-12-31 DIAGNOSIS — R928 Other abnormal and inconclusive findings on diagnostic imaging of breast: Secondary | ICD-10-CM

## 2023-04-16 ENCOUNTER — Ambulatory Visit: Payer: Medicare HMO | Admitting: Podiatry

## 2023-05-06 ENCOUNTER — Ambulatory Visit
Admission: RE | Admit: 2023-05-06 | Discharge: 2023-05-06 | Disposition: A | Discharge status: Home / Self Care | Payer: Medicare HMO | Source: Ambulatory Visit | Attending: Family | Admitting: Family

## 2023-05-06 DIAGNOSIS — Z13820 Encounter for screening for osteoporosis: Secondary | ICD-10-CM

## 2023-05-14 ENCOUNTER — Encounter: Payer: Self-pay | Admitting: Podiatry

## 2023-05-14 ENCOUNTER — Ambulatory Visit: Payer: Medicare HMO | Admitting: Podiatry

## 2023-05-14 DIAGNOSIS — B351 Tinea unguium: Secondary | ICD-10-CM

## 2023-05-14 DIAGNOSIS — M79674 Pain in right toe(s): Secondary | ICD-10-CM

## 2023-05-14 DIAGNOSIS — M79675 Pain in left toe(s): Secondary | ICD-10-CM

## 2023-05-18 NOTE — Progress Notes (Signed)
  Subjective:  Patient ID: Danielle Sexton, female    DOB: Oct 23, 1945,  MRN: [id number]  HARLIE BUENING presents to clinic today for: painful elongated mycotic toenails 1-5 bilaterally which are tender when wearing enclosed shoe gear. Pain is relieved with periodic professional debridement.  She will be having knee surgery next month.  PCP is Vladimir Crofts, FNP.  Allergies  Allergen Reactions   Latex Swelling   Review of Systems: Negative except as noted in the HPI.  Objective: No changes noted in today's physical examination. There were no vitals filed for this visit.  Danielle Sexton is a pleasant 77 y.o. female in NAD. AAO x 3.  Vascular Examination: Capillary refill time <3 seconds b/l LE. Palpable pedal pulses b/l LE. Digital hair present b/l. No pedal edema b/l. Skin temperature gradient WNL b/l. No varicosities b/l. Marland Kitchen  Dermatological Examination: Pedal skin with normal turgor, texture and tone b/l. No open wounds. No interdigital macerations b/l. Toenails 1-5 b/l thickened, discolored, dystrophic with subungual debris. There is pain on palpation to dorsal aspect of nailplates.  Neurological Examination: Protective sensation intact with 10 gram monofilament b/l LE. Vibratory sensation intact b/l LE.   Musculoskeletal Examination: Normal muscle strength 5/5 to all lower extremity muscle groups bilaterally. HAV with bunion deformity noted b/l LE. Hammertoe deformity noted 2-5 b/l.Marland Kitchen No pain, crepitus or joint limitation noted with ROM b/l LE.  Patient ambulates independently without assistive aids.  Assessment/Plan: 1. Pain due to onychomycosis of toenails of both feet    -Consent given for treatment as described below: -Examined patient. -Mycotic toenails 1-5 bilaterally were debrided in length and girth with sterile nail nippers and dremel without incident. -Patient/POA to call should there be question/concern in the interim.   Return in about 3 months (around  08/14/2023).  Freddie Breech, DPM

## 2023-06-19 NOTE — Patient Instructions (Addendum)
SURGICAL WAITING ROOM VISITATION  Patients having surgery or a procedure may have no more than 2 support people in the waiting area - these visitors may rotate.    Children under the age of 66 must have an adult with them who is not the patient.  Due to an increase in RSV and influenza rates and associated hospitalizations, children ages 67 and under may not visit patients in [facility] hospitals.  If the patient needs to stay at the hospital during part of their recovery, the visitor guidelines for inpatient rooms apply. Pre-op nurse will coordinate an appropriate time for 1 support person to accompany patient in pre-op.  This support person may not rotate.    Please refer to the [facility] website for the visitor guidelines for Inpatients (after your surgery is over and you are in a regular room).    Your procedure is scheduled on: 07/01/23   Report to [facility] Main Entrance    Report to admitting at 5:15 AM   Call this number if you have problems the morning of surgery [phone]   Do not eat food :After Midnight.   After Midnight you may have the following liquids until 4:15 AM DAY OF SURGERY  Water Non-Citrus Juices (without pulp, NO RED-Apple, White grape, White cranberry) Black Coffee (NO MILK/CREAM OR CREAMERS, sugar ok)  Clear Tea (NO MILK/CREAM OR CREAMERS, sugar ok) regular and decaf                             Plain Jell-O (NO RED)                                           Fruit ices (not with fruit pulp, NO RED)                                     Popsicles (NO RED)                                                               Sports drinks like Gatorade (NO RED)    The day of surgery:  Drink ONE (1) Pre-Surgery Clear Ensure at 4:15 AM the morning of surgery. Drink in one sitting. Do not sip.  This drink was given to you during your hospital  pre-op appointment visit. Nothing else to drink after completing the  Pre-Surgery Clear Ensure.          If  you have questions, please contact your surgeon's office.   FOLLOW BOWEL PREP AND ANY ADDITIONAL PRE OP INSTRUCTIONS YOU RECEIVED FROM YOUR SURGEON'S OFFICE!!!     Oral Hygiene is also important to reduce your risk of infection.                                    Remember - BRUSH YOUR TEETH THE MORNING OF SURGERY WITH YOUR REGULAR TOOTHPASTE  DENTURES WILL BE REMOVED PRIOR TO SURGERY PLEASE DO NOT APPLY "Poly grip" OR ADHESIVES!!!  Stop all vitamins and herbal supplements 7 days before surgery.   Take these medicines the morning of surgery with A SIP OF WATER: Tylenol, Amlodipine, Buspirone, Allegra, Flonase, Hydralazine, Pantoprazole                               You may not have any metal on your body including hair pins, jewelry, and body piercing             Do not wear make-up, lotions, powders, perfumes, or deodorant  Do not wear nail polish including gel and S&S, artificial/acrylic nails, or any other type of covering on natural nails including finger and toenails. If you have artificial nails, gel coating, etc. that needs to be removed by a nail salon please have this removed prior to surgery or surgery may need to be canceled/ delayed if the surgeon/ anesthesia feels like they are unable to be safely monitored.   Do not shave  48 hours prior to surgery.    Do not bring valuables to the hospital. [facility] IS NOT             RESPONSIBLE   FOR VALUABLES.   Contacts, glasses, dentures or bridgework may not be worn into surgery.   Bring small overnight bag day of surgery.   DO NOT BRING YOUR HOME MEDICATIONS TO THE HOSPITAL. PHARMACY WILL DISPENSE MEDICATIONS LISTED ON YOUR MEDICATION LIST TO YOU DURING YOUR ADMISSION IN THE HOSPITAL!              Please read over the following fact sheets you were given: IF YOU HAVE QUESTIONS ABOUT YOUR PRE-OP INSTRUCTIONS PLEASE CALL [name]   If you received a COVID test during your pre-op visit  it is requested that you  wear a mask when out in public, stay away from anyone that may not be feeling well and notify your surgeon if you develop symptoms. If you test positive for Covid or have been in contact with anyone that has tested positive in the last 10 days please notify you surgeon.      Pre-operative 5 CHG Bath Instructions   You can play a key role in reducing the risk of infection after surgery. Your skin needs to be as free of germs as possible. You can reduce the number of germs on your skin by washing with CHG (chlorhexidine gluconate) soap before surgery. CHG is an antiseptic soap that kills germs and continues to kill germs even after washing.   DO NOT use if you have an allergy to chlorhexidine/CHG or antibacterial soaps. If your skin becomes reddened or irritated, stop using the CHG and notify one of our RNs at [phone].   Please shower with the CHG soap starting 4 days before surgery using the following schedule:     Please keep in mind the following:  DO NOT shave, including legs and underarms, starting the day of your first shower.   You may shave your face at any point before/day of surgery.  Place clean sheets on your bed the day you start using CHG soap. Use a clean washcloth (not used since being washed) for each shower. DO NOT sleep with pets once you start using the CHG.   CHG Shower Instructions:  If you choose to wash your hair and private area, wash first with your normal shampoo/soap.  After you use shampoo/soap, rinse your hair and body thoroughly to remove shampoo/soap residue.  Turn the water OFF and apply about 3 tablespoons (45 ml) of CHG soap to a CLEAN washcloth.  Apply CHG soap ONLY FROM YOUR NECK DOWN TO YOUR TOES (washing for 3-5 minutes)  DO NOT use CHG soap on face, private areas, open wounds, or sores.  Pay special attention to the area where your surgery is being performed.  If you are having back surgery, having someone wash your back for you may be  helpful. Wait 2 minutes after CHG soap is applied, then you may rinse off the CHG soap.  Pat dry with a clean towel  Put on clean clothes/pajamas   If you choose to wear lotion, please use ONLY the CHG-compatible lotions on the back of this paper.     Additional instructions for the day of surgery: DO NOT APPLY any lotions, deodorants, cologne, or perfumes.   Put on clean/comfortable clothes.  Brush your teeth.  Ask your nurse before applying any prescription medications to the skin.      CHG Compatible Lotions   Aveeno Moisturizing lotion  Cetaphil Moisturizing Cream  Cetaphil Moisturizing Lotion  Clairol Herbal Essence Moisturizing Lotion, Dry Skin  Clairol Herbal Essence Moisturizing Lotion, Extra Dry Skin  Clairol Herbal Essence Moisturizing Lotion, Normal Skin  Curel Age Defying Therapeutic Moisturizing Lotion with Alpha Hydroxy  Curel Extreme Care Body Lotion  Curel Soothing Hands Moisturizing Hand Lotion  Curel Therapeutic Moisturizing Cream, Fragrance-Free  Curel Therapeutic Moisturizing Lotion, Fragrance-Free  Curel Therapeutic Moisturizing Lotion, Original Formula  Eucerin Daily Replenishing Lotion  Eucerin Dry Skin Therapy Plus Alpha Hydroxy Crme  Eucerin Dry Skin Therapy Plus Alpha Hydroxy Lotion  Eucerin Original Crme  Eucerin Original Lotion  Eucerin Plus Crme Eucerin Plus Lotion  Eucerin TriLipid Replenishing Lotion  Keri Anti-Bacterial Hand Lotion  Keri Deep Conditioning Original Lotion Dry Skin Formula Softly Scented  Keri Deep Conditioning Original Lotion, Fragrance Free Sensitive Skin Formula  Keri Lotion Fast Absorbing Fragrance Free Sensitive Skin Formula  Keri Lotion Fast Absorbing Softly Scented Dry Skin Formula  Keri Original Lotion  Keri Skin Renewal Lotion Keri Silky Smooth Lotion  Keri Silky Smooth Sensitive Skin Lotion  Nivea Body Creamy Conditioning Oil  Nivea Body Extra Enriched Lotion  Nivea Body Original Lotion  Nivea Body Sheer  Moisturizing Lotion Nivea Crme  Nivea Skin Firming Lotion  NutraDerm 30 Skin Lotion  NutraDerm Skin Lotion  NutraDerm Therapeutic Skin Cream  NutraDerm Therapeutic Skin Lotion  ProShield Protective Hand Cream  Provon moisturizing lotion   Incentive Spirometer  An incentive spirometer is a tool that can help keep your lungs clear and active. This tool measures how well you are filling your lungs with each breath. Taking long deep breaths may help reverse or decrease the chance of developing breathing (pulmonary) problems (especially infection) following: A long period of time when you are unable to move or be active. BEFORE THE PROCEDURE  If the spirometer includes an indicator to show your best effort, your nurse or respiratory therapist will set it to a desired goal. If possible, sit up straight or lean slightly forward. Try not to slouch. Hold the incentive spirometer in an upright position. INSTRUCTIONS FOR USE  Sit on the edge of your bed if possible, or sit up as far as you can in bed or on a chair. Hold the incentive spirometer in an upright position. Breathe out normally. Place the mouthpiece in your mouth and seal your lips tightly around it. Breathe in slowly and as deeply as possible, raising  the piston or the ball toward the top of the column. Hold your breath for 3-5 seconds or for as long as possible. Allow the piston or ball to fall to the bottom of the column. Remove the mouthpiece from your mouth and breathe out normally. Rest for a few seconds and repeat Steps 1 through 7 at least 10 times every 1-2 hours when you are awake. Take your time and take a few normal breaths between deep breaths. The spirometer may include an indicator to show your best effort. Use the indicator as a goal to work toward during each repetition. After each set of 10 deep breaths, practice coughing to be sure your lungs are clear. If you have an incision (the cut made at the time of surgery),  support your incision when coughing by placing a pillow or rolled up towels firmly against it. Once you are able to get out of bed, walk around indoors and cough well. You may stop using the incentive spirometer when instructed by your caregiver.  RISKS AND COMPLICATIONS Take your time so you do not get dizzy or light-headed. If you are in pain, you may need to take or ask for pain medication before doing incentive spirometry. It is harder to take a deep breath if you are having pain. AFTER USE Rest and breathe slowly and easily. It can be helpful to keep track of a log of your progress. Your caregiver can provide you with a simple table to help with this. If you are using the spirometer at home, follow these instructions: SEEK MEDICAL CARE IF:  You are having difficultly using the spirometer. You have trouble using the spirometer as often as instructed. Your pain medication is not giving enough relief while using the spirometer. You develop fever of 100.5 F (38.1 C) or higher. SEEK IMMEDIATE MEDICAL CARE IF:  You cough up bloody sputum that had not been present before. You develop fever of 102 F (38.9 C) or greater. You develop worsening pain at or near the incision site. MAKE SURE YOU:  Understand these instructions. Will watch your condition. Will get help right away if you are not doing well or get worse. Document Released: 02/10/2007 Document Revised: 12/23/2011 Document Reviewed: 04/13/2007 [facility] Patient Information 2014 [place], [place].   ________________________________________________________________________

## 2023-06-19 NOTE — Progress Notes (Addendum)
COVID Vaccine Completed: yes  Date of COVID positive in last 90 days: no  PCP - Baruch Goldmann, FNP Cardiologist - n/a  Medical Clearance by Baruch Goldmann 96/24 on chart  Chest x-ray - n/a EKG - 06/20/23 Epic/chart Stress Test - n/a ECHO - 09/11/06 Epic Cardiac Cath - n/a Pacemaker/ICD device last checked: n/a Spinal Cord Stimulator: n/a  Bowel Prep - no  Sleep Study - n/a CPAP -   Fasting Blood Sugar - n/a Checks Blood Sugar _____ times a day  Last dose of GLP1 agonist-  N/A GLP1 instructions:  N/A   Last dose of SGLT-2 inhibitors-  N/A SGLT-2 instructions: N/A   Blood Thinner Instructions: n/a Aspirin Instructions: Last Dose:  Activity level: Can go up a flight of stairs and perform activities of daily living without stopping and without symptoms of chest pain or shortness of breath.  Anesthesia review: ST wave changes, HTN, heart murmur   Patient denies shortness of breath, fever, cough and chest pain at PAT appointment  Patient verbalized understanding of instructions that were given to them at the PAT appointment. Patient was also instructed that they will need to review over the PAT instructions again at home before surgery.

## 2023-06-20 ENCOUNTER — Other Ambulatory Visit: Payer: Self-pay

## 2023-06-20 ENCOUNTER — Encounter (HOSPITAL_COMMUNITY): Payer: Self-pay

## 2023-06-20 ENCOUNTER — Encounter (HOSPITAL_COMMUNITY)
Admission: RE | Admit: 2023-06-20 | Discharge: 2023-06-20 | Disposition: A | Discharge status: Home / Self Care | Payer: Medicare HMO | Source: Ambulatory Visit | Attending: Orthopedic Surgery | Admitting: Orthopedic Surgery

## 2023-06-20 VITALS — BP 164/71 | HR 80 | Temp 97.9°F | Resp 16 | Ht 64.0 in | Wt 189.0 lb

## 2023-06-20 DIAGNOSIS — I10 Essential (primary) hypertension: Secondary | ICD-10-CM | POA: Diagnosis not present

## 2023-06-20 DIAGNOSIS — Z01818 Encounter for other preprocedural examination: Secondary | ICD-10-CM | POA: Diagnosis present

## 2023-06-20 LAB — CBC
HCT: 33.3 % — ABNORMAL LOW (ref 36.0–46.0)
Hemoglobin: 11 g/dL — ABNORMAL LOW (ref 12.0–15.0)
MCH: 30.9 pg (ref 26.0–34.0)
MCHC: 33 g/dL (ref 30.0–36.0)
MCV: 93.5 fL (ref 80.0–100.0)
Platelets: 190 10*3/uL (ref 150–400)
RBC: 3.56 MIL/uL — ABNORMAL LOW (ref 3.87–5.11)
RDW: 13.3 % (ref 11.5–15.5)
WBC: 6.6 10*3/uL (ref 4.0–10.5)
nRBC: 0 % (ref 0.0–0.2)

## 2023-06-20 LAB — BASIC METABOLIC PANEL
Anion gap: 8 (ref 5–15)
BUN: 12 mg/dL (ref 8–23)
CO2: 22 mmol/L (ref 22–32)
Calcium: 9.4 mg/dL (ref 8.9–10.3)
Chloride: 105 mmol/L (ref 98–111)
Creatinine, Ser: 0.59 mg/dL (ref 0.44–1.00)
GFR, Estimated: >60 mL/min (ref >60)
Glucose, Bld: 95 mg/dL (ref 70–99)
Potassium: 3.5 mmol/L (ref 3.5–5.1)
Sodium: 135 mmol/L (ref 135–145)

## 2023-06-20 LAB — SURGICAL PCR SCREEN
MRSA, PCR: NEGATIVE
Staphylococcus aureus: NEGATIVE

## 2023-06-26 ENCOUNTER — Ambulatory Visit: Payer: Medicare HMO | Admitting: Physical Therapy

## 2023-06-30 NOTE — Anesthesia Preprocedure Evaluation (Signed)
Anesthesia Evaluation  Patient identified by MRN, date of birth, ID band Patient awake    Reviewed: Allergy & Precautions, NPO status , Patient's Chart, lab work & pertinent test results  Airway Mallampati: I  TM Distance: >3 FB Neck ROM: Full    Dental  (+) Teeth Intact, Dental Advisory Given   Pulmonary neg pulmonary ROS   Pulmonary exam normal breath sounds clear to auscultation       Cardiovascular hypertension (172/74 preop- per pt normally 140s SBP), Pt. on medications Normal cardiovascular exam Rhythm:Regular Rate:Normal     Neuro/Psych  PSYCHIATRIC DISORDERS Anxiety     negative neurological ROS     GI/Hepatic Neg liver ROS, hiatal hernia,GERD  Controlled,,  Endo/Other  negative endocrine ROS    Renal/GU negative Renal ROS  negative genitourinary   Musculoskeletal  (+) Arthritis , Osteoarthritis,    Abdominal   Peds  Hematology negative hematology ROS (+)   Anesthesia Other Findings   Reproductive/Obstetrics negative OB ROS                             Anesthesia Physical Anesthesia Plan  ASA: 2  Anesthesia Plan: Spinal, Regional and MAC   Post-op Pain Management: Regional block* and Tylenol PO (pre-op)*   Induction:   PONV Risk Score and Plan: 2 and Propofol infusion and TIVA  Airway Management Planned: Natural Airway and Nasal Cannula  Additional Equipment: None  Intra-op Plan:   Post-operative Plan:   Informed Consent: I have reviewed the patients History and Physical, chart, labs and discussed the procedure including the risks, benefits and alternatives for the proposed anesthesia with the patient or authorized representative who has indicated his/her understanding and acceptance.       Plan Discussed with: CRNA  Anesthesia Plan Comments:        Anesthesia Quick Evaluation

## 2023-07-01 ENCOUNTER — Observation Stay (HOSPITAL_COMMUNITY)
Admission: RE | Admit: 2023-07-01 | Discharge: 2023-07-02 | Disposition: A | Discharge status: Home / Self Care | Payer: Medicare HMO | Source: Ambulatory Visit | Attending: Orthopedic Surgery | Admitting: Orthopedic Surgery

## 2023-07-01 ENCOUNTER — Other Ambulatory Visit: Payer: Self-pay

## 2023-07-01 ENCOUNTER — Ambulatory Visit (HOSPITAL_BASED_OUTPATIENT_CLINIC_OR_DEPARTMENT_OTHER): Payer: Medicare HMO | Admitting: Anesthesiology

## 2023-07-01 ENCOUNTER — Encounter (HOSPITAL_COMMUNITY)
Admission: RE | Disposition: A | Discharge status: Home / Self Care | Payer: Self-pay | Source: Ambulatory Visit | Attending: Orthopedic Surgery

## 2023-07-01 ENCOUNTER — Encounter (HOSPITAL_COMMUNITY): Payer: Self-pay | Admitting: Orthopedic Surgery

## 2023-07-01 ENCOUNTER — Ambulatory Visit (HOSPITAL_COMMUNITY): Payer: Medicare HMO | Admitting: Physician Assistant

## 2023-07-01 DIAGNOSIS — I10 Essential (primary) hypertension: Secondary | ICD-10-CM | POA: Insufficient documentation

## 2023-07-01 DIAGNOSIS — Z96641 Presence of right artificial hip joint: Secondary | ICD-10-CM | POA: Diagnosis not present

## 2023-07-01 DIAGNOSIS — Z91040 Latex allergy status: Secondary | ICD-10-CM | POA: Diagnosis not present

## 2023-07-01 DIAGNOSIS — M1712 Unilateral primary osteoarthritis, left knee: Secondary | ICD-10-CM

## 2023-07-01 DIAGNOSIS — Z96652 Presence of left artificial knee joint: Principal | ICD-10-CM

## 2023-07-01 HISTORY — PX: TOTAL KNEE ARTHROPLASTY: SHX125

## 2023-07-01 SURGERY — ARTHROPLASTY, KNEE, TOTAL
Anesthesia: Monitor Anesthesia Care | Site: Knee | Laterality: Left

## 2023-07-01 MED ORDER — PROPOFOL 500 MG/50ML IV EMUL
INTRAVENOUS | Status: DC | PRN
Start: 2023-07-01 — End: 2023-07-01
  Administered 2023-07-01 (×2): 20 mg via INTRAVENOUS
  Administered 2023-07-01: 50 ug/kg/min via INTRAVENOUS
  Administered 2023-07-01: 20 mg via INTRAVENOUS

## 2023-07-01 MED ORDER — ONDANSETRON HCL 4 MG/2ML IJ SOLN: 4.0000 mg | Freq: Once | INTRAMUSCULAR | Status: DC | PRN

## 2023-07-01 MED ORDER — LACTATED RINGERS IV SOLN: INTRAVENOUS | Status: DC

## 2023-07-01 MED ORDER — ONDANSETRON HCL 4 MG/2ML IJ SOLN
INTRAMUSCULAR | Status: AC
  Filled 2023-07-01: qty 2

## 2023-07-01 MED ORDER — ALUM HYDROXIDE-MAG TRISILICATE 80-20 MG PO CHEW: 2.0000 | CHEWABLE_TABLET | Freq: Three times a day (TID) | ORAL | Status: DC | PRN

## 2023-07-01 MED ORDER — BUPIVACAINE IN DEXTROSE 0.75-8.25 % IT SOLN
INTRATHECAL | Status: DC | PRN
Start: 2023-07-01 — End: 2023-07-01
  Administered 2023-07-01: 1.8 mL via INTRATHECAL

## 2023-07-01 MED ORDER — MENTHOL 3 MG MT LOZG: 1.0000 | LOZENGE | OROMUCOSAL | Status: DC | PRN

## 2023-07-01 MED ORDER — AMISULPRIDE (ANTIEMETIC) 5 MG/2ML IV SOLN: 10.0000 mg | Freq: Once | INTRAVENOUS | Status: DC | PRN

## 2023-07-01 MED ORDER — FLUTICASONE PROPIONATE 50 MCG/ACT NA SUSP: 1.0000 | Freq: Every day | NASAL | Status: DC | PRN

## 2023-07-01 MED ORDER — POVIDONE-IODINE 10 % EX SWAB: 2.0000 | Freq: Once | CUTANEOUS | Status: DC

## 2023-07-01 MED ORDER — ONDANSETRON HCL 4 MG/2ML IJ SOLN: 4.0000 mg | Freq: Four times a day (QID) | INTRAMUSCULAR | Status: DC | PRN

## 2023-07-01 MED ORDER — LORATADINE 10 MG PO TABS
10.0000 mg | ORAL_TABLET | Freq: Every day | ORAL | Status: DC
  Administered 2023-07-01 – 2023-07-02 (×2): 10 mg via ORAL
  Filled 2023-07-01 (×2): qty 1

## 2023-07-01 MED ORDER — DEXAMETHASONE SODIUM PHOSPHATE 10 MG/ML IJ SOLN
10.0000 mg | Freq: Once | INTRAMUSCULAR | Status: AC
  Administered 2023-07-02: 10 mg via INTRAVENOUS
  Filled 2023-07-01: qty 1

## 2023-07-01 MED ORDER — OXYCODONE HCL 5 MG PO TABS
ORAL_TABLET | ORAL | Status: AC
  Filled 2023-07-01: qty 1

## 2023-07-01 MED ORDER — BUPIVACAINE-EPINEPHRINE 0.25% -1:200000 IJ SOLN
INTRAMUSCULAR | Status: AC
  Filled 2023-07-01: qty 1

## 2023-07-01 MED ORDER — HYDROMORPHONE HCL 1 MG/ML IJ SOLN: 0.2500 mg | INTRAMUSCULAR | Status: DC | PRN

## 2023-07-01 MED ORDER — METOCLOPRAMIDE HCL 5 MG PO TABS: 5.0000 mg | ORAL_TABLET | Freq: Three times a day (TID) | ORAL | Status: DC | PRN

## 2023-07-01 MED ORDER — ONDANSETRON HCL 4 MG/2ML IJ SOLN
INTRAMUSCULAR | Status: DC | PRN
  Administered 2023-07-01: 4 mg via INTRAVENOUS

## 2023-07-01 MED ORDER — SODIUM CHLORIDE 0.9 % IV SOLN: INTRAVENOUS | Status: DC

## 2023-07-01 MED ORDER — PANTOPRAZOLE SODIUM 40 MG PO TBEC: 40.0000 mg | DELAYED_RELEASE_TABLET | Freq: Every day | ORAL | Status: DC | PRN

## 2023-07-01 MED ORDER — TRANEXAMIC ACID-NACL 1000-0.7 MG/100ML-% IV SOLN
1000.0000 mg | INTRAVENOUS | Status: AC
  Administered 2023-07-01: 1000 mg via INTRAVENOUS
  Filled 2023-07-01: qty 100

## 2023-07-01 MED ORDER — DIPHENHYDRAMINE HCL 12.5 MG/5ML PO ELIX: 12.5000 mg | ORAL_SOLUTION | ORAL | Status: DC | PRN

## 2023-07-01 MED ORDER — IRBESARTAN 150 MG PO TABS: 300.0000 mg | ORAL_TABLET | Freq: Every day | ORAL | Status: DC

## 2023-07-01 MED ORDER — AMLODIPINE BESYLATE 10 MG PO TABS
10.0000 mg | ORAL_TABLET | Freq: Every day | ORAL | Status: DC
  Administered 2023-07-02: 10 mg via ORAL
  Filled 2023-07-01: qty 1

## 2023-07-01 MED ORDER — OXYCODONE HCL 5 MG PO TABS: 10.0000 mg | ORAL_TABLET | ORAL | Status: DC | PRN

## 2023-07-01 MED ORDER — DEXAMETHASONE SODIUM PHOSPHATE 10 MG/ML IJ SOLN
INTRAMUSCULAR | Status: DC | PRN
Start: 2023-07-01 — End: 2023-07-01
  Administered 2023-07-01: 10 mg

## 2023-07-01 MED ORDER — KETOROLAC TROMETHAMINE 30 MG/ML IJ SOLN
INTRAMUSCULAR | Status: AC
  Filled 2023-07-01: qty 1

## 2023-07-01 MED ORDER — BISACODYL 10 MG RE SUPP: 10.0000 mg | Freq: Every day | RECTAL | Status: DC | PRN

## 2023-07-01 MED ORDER — ORAL CARE MOUTH RINSE: 15.0000 mL | Freq: Once | OROMUCOSAL | Status: AC

## 2023-07-01 MED ORDER — POLYETHYLENE GLYCOL 3350 17 G PO PACK
17.0000 g | PACK | Freq: Two times a day (BID) | ORAL | Status: DC
  Administered 2023-07-01 – 2023-07-02 (×2): 17 g via ORAL
  Filled 2023-07-01 (×2): qty 1

## 2023-07-01 MED ORDER — SODIUM CHLORIDE (PF) 0.9 % IJ SOLN
INTRAMUSCULAR | Status: DC | PRN
  Administered 2023-07-01: 61 mL

## 2023-07-01 MED ORDER — METHOCARBAMOL 500 MG PO TABS
500.0000 mg | ORAL_TABLET | Freq: Four times a day (QID) | ORAL | Status: DC | PRN
  Administered 2023-07-01: 500 mg via ORAL
  Filled 2023-07-01: qty 1

## 2023-07-01 MED ORDER — CEFAZOLIN SODIUM-DEXTROSE 2-4 GM/100ML-% IV SOLN
2.0000 g | Freq: Four times a day (QID) | INTRAVENOUS | Status: AC
  Administered 2023-07-01 (×2): 2 g via INTRAVENOUS
  Filled 2023-07-01 (×2): qty 100

## 2023-07-01 MED ORDER — ACETAMINOPHEN 500 MG PO TABS
1000.0000 mg | ORAL_TABLET | Freq: Four times a day (QID) | ORAL | Status: DC
  Administered 2023-07-01 – 2023-07-02 (×4): 1000 mg via ORAL
  Filled 2023-07-01 (×4): qty 2

## 2023-07-01 MED ORDER — DEXAMETHASONE SODIUM PHOSPHATE 10 MG/ML IJ SOLN
8.0000 mg | Freq: Once | INTRAMUSCULAR | Status: AC
  Administered 2023-07-01: 8 mg via INTRAVENOUS

## 2023-07-01 MED ORDER — STERILE WATER FOR IRRIGATION IR SOLN
Status: DC | PRN
  Administered 2023-07-01: 2000 mL

## 2023-07-01 MED ORDER — FENTANYL CITRATE (PF) 100 MCG/2ML IJ SOLN
INTRAMUSCULAR | Status: DC | PRN
  Administered 2023-07-01: 50 ug via INTRAVENOUS

## 2023-07-01 MED ORDER — OXYCODONE HCL 5 MG/5ML PO SOLN: 5.0000 mg | Freq: Once | ORAL | Status: AC | PRN

## 2023-07-01 MED ORDER — GLYCOPYRROLATE 0.2 MG/ML IJ SOLN
INTRAMUSCULAR | Status: AC
  Filled 2023-07-01: qty 1

## 2023-07-01 MED ORDER — OXYCODONE HCL 5 MG PO TABS
5.0000 mg | ORAL_TABLET | Freq: Once | ORAL | Status: AC | PRN
  Administered 2023-07-01: 5 mg via ORAL

## 2023-07-01 MED ORDER — SODIUM CHLORIDE (PF) 0.9 % IJ SOLN
INTRAMUSCULAR | Status: AC
  Filled 2023-07-01: qty 50

## 2023-07-01 MED ORDER — OXYCODONE HCL 5 MG PO TABS
5.0000 mg | ORAL_TABLET | ORAL | Status: DC | PRN
  Administered 2023-07-01 – 2023-07-02 (×3): 5 mg via ORAL
  Filled 2023-07-01 (×3): qty 1

## 2023-07-01 MED ORDER — BUSPIRONE HCL 5 MG PO TABS
7.5000 mg | ORAL_TABLET | Freq: Two times a day (BID) | ORAL | Status: DC
  Administered 2023-07-01 – 2023-07-02 (×2): 7.5 mg via ORAL
  Filled 2023-07-01 (×2): qty 2

## 2023-07-01 MED ORDER — GLYCOPYRROLATE 0.2 MG/ML IJ SOLN
INTRAMUSCULAR | Status: DC | PRN
Start: 2023-07-01 — End: 2023-07-01
  Administered 2023-07-01: .1 mg via INTRAVENOUS

## 2023-07-01 MED ORDER — ALUM & MAG HYDROXIDE-SIMETH 200-200-20 MG/5ML PO SUSP: 30.0000 mL | ORAL | Status: DC | PRN

## 2023-07-01 MED ORDER — METHOCARBAMOL 500 MG IVPB - SIMPLE MED: 500.0000 mg | Freq: Four times a day (QID) | INTRAVENOUS | Status: DC | PRN

## 2023-07-01 MED ORDER — FENTANYL CITRATE (PF) 100 MCG/2ML IJ SOLN
INTRAMUSCULAR | Status: AC
  Filled 2023-07-01: qty 2

## 2023-07-01 MED ORDER — TRANEXAMIC ACID-NACL 1000-0.7 MG/100ML-% IV SOLN
1000.0000 mg | Freq: Once | INTRAVENOUS | Status: AC
  Administered 2023-07-01: 1000 mg via INTRAVENOUS
  Filled 2023-07-01: qty 100

## 2023-07-01 MED ORDER — 0.9 % SODIUM CHLORIDE (POUR BTL) OPTIME
TOPICAL | Status: DC | PRN
  Administered 2023-07-01: 1000 mL

## 2023-07-01 MED ORDER — ASPIRIN 81 MG PO CHEW
81.0000 mg | CHEWABLE_TABLET | Freq: Two times a day (BID) | ORAL | Status: DC
  Administered 2023-07-01 – 2023-07-02 (×2): 81 mg via ORAL
  Filled 2023-07-01 (×2): qty 1

## 2023-07-01 MED ORDER — SENNA 8.6 MG PO TABS
2.0000 | ORAL_TABLET | Freq: Every day | ORAL | Status: DC
  Administered 2023-07-01: 17.2 mg via ORAL
  Filled 2023-07-01: qty 2

## 2023-07-01 MED ORDER — PHENYLEPHRINE HCL-NACL 20-0.9 MG/250ML-% IV SOLN
INTRAVENOUS | Status: DC | PRN
Start: 2023-07-01 — End: 2023-07-01
  Administered 2023-07-01: 20 ug/min via INTRAVENOUS

## 2023-07-01 MED ORDER — CHLORHEXIDINE GLUCONATE 0.12 % MT SOLN
15.0000 mL | Freq: Once | OROMUCOSAL | Status: AC
  Administered 2023-07-01: 15 mL via OROMUCOSAL

## 2023-07-01 MED ORDER — PROPOFOL 1000 MG/100ML IV EMUL
INTRAVENOUS | Status: AC
  Filled 2023-07-01: qty 100

## 2023-07-01 MED ORDER — DEXAMETHASONE SODIUM PHOSPHATE 10 MG/ML IJ SOLN
INTRAMUSCULAR | Status: AC
  Filled 2023-07-01: qty 1

## 2023-07-01 MED ORDER — ROPIVACAINE HCL 5 MG/ML IJ SOLN
INTRAMUSCULAR | Status: DC | PRN
Start: 2023-07-01 — End: 2023-07-01
  Administered 2023-07-01: 25 mL via PERINEURAL

## 2023-07-01 MED ORDER — MIDAZOLAM HCL 2 MG/2ML IJ SOLN
INTRAMUSCULAR | Status: AC
  Filled 2023-07-01: qty 2

## 2023-07-01 MED ORDER — ACETAMINOPHEN 500 MG PO TABS
1000.0000 mg | ORAL_TABLET | Freq: Once | ORAL | Status: AC
  Administered 2023-07-01: 1000 mg via ORAL
  Filled 2023-07-01: qty 2

## 2023-07-01 MED ORDER — ONDANSETRON HCL 4 MG PO TABS: 4.0000 mg | ORAL_TABLET | Freq: Four times a day (QID) | ORAL | Status: DC | PRN

## 2023-07-01 MED ORDER — HYDROMORPHONE HCL 1 MG/ML IJ SOLN: 0.5000 mg | INTRAMUSCULAR | Status: DC | PRN

## 2023-07-01 MED ORDER — METOCLOPRAMIDE HCL 5 MG/ML IJ SOLN: 5.0000 mg | Freq: Three times a day (TID) | INTRAMUSCULAR | Status: DC | PRN

## 2023-07-01 MED ORDER — MIDAZOLAM HCL 5 MG/5ML IJ SOLN
INTRAMUSCULAR | Status: DC | PRN
Start: 2023-07-01 — End: 2023-07-01
  Administered 2023-07-01 (×2): .5 mg via INTRAVENOUS

## 2023-07-01 MED ORDER — PHENOL 1.4 % MT LIQD: 1.0000 | OROMUCOSAL | Status: DC | PRN

## 2023-07-01 MED ORDER — HYDRALAZINE HCL 50 MG PO TABS
50.0000 mg | ORAL_TABLET | Freq: Two times a day (BID) | ORAL | Status: DC
  Administered 2023-07-01 – 2023-07-02 (×2): 50 mg via ORAL
  Filled 2023-07-01 (×2): qty 1

## 2023-07-01 MED ORDER — MELOXICAM 15 MG PO TABS
7.5000 mg | ORAL_TABLET | Freq: Every day | ORAL | Status: DC
  Administered 2023-07-02: 7.5 mg via ORAL
  Filled 2023-07-01: qty 1

## 2023-07-01 MED ORDER — CEFAZOLIN SODIUM-DEXTROSE 2-4 GM/100ML-% IV SOLN
2.0000 g | INTRAVENOUS | Status: AC
  Administered 2023-07-01: 2 g via INTRAVENOUS
  Filled 2023-07-01: qty 100

## 2023-07-01 MED ORDER — SODIUM CHLORIDE 0.9 % IR SOLN
Status: DC | PRN
  Administered 2023-07-01: 1000 mL

## 2023-07-01 SURGICAL SUPPLY — 57 items
ADH SKN CLS APL DERMABOND .7 (GAUZE/BANDAGES/DRESSINGS) ×1
ATTUNE MED ANAT PAT 32 KNEE (Knees) IMPLANT
BAG COUNTER SPONGE SURGICOUNT (BAG) IMPLANT
BAG SPEC THK2 15X12 ZIP CLS (MISCELLANEOUS)
BAG SPNG CNTER NS LX DISP (BAG)
BAG ZIPLOCK 12X15 (MISCELLANEOUS) IMPLANT
BASEPLATE TIB CMT FB PCKT SZ3 (Knees) IMPLANT
BLADE SAW SGTL 13.0X1.19X90.0M (BLADE) ×2 IMPLANT
BNDG CMPR 6 X 5 YARDS HK CLSR (GAUZE/BANDAGES/DRESSINGS) ×1
BNDG ELASTIC 6INX 5YD STR LF (GAUZE/BANDAGES/DRESSINGS) ×2 IMPLANT
BOWL SMART MIX CTS (DISPOSABLE) ×2 IMPLANT
BSPLAT TIB 3 CMNT FXBRNG STRL (Knees) ×1 IMPLANT
CEMENT HV SMART SET (Cement) IMPLANT
COMP FEM CMT ATTUNE NRW 5 LT (Joint) ×1 IMPLANT
COMPONENT FEM CMT ATTN NRW 5LT (Joint) IMPLANT
COVER SURGICAL LIGHT HANDLE (MISCELLANEOUS) ×2 IMPLANT
CUFF TOURN SGL QUICK 34 (TOURNIQUET CUFF) ×1
CUFF TRNQT CYL 34X4.125X (TOURNIQUET CUFF) ×2 IMPLANT
DERMABOND ADVANCED .7 DNX12 (GAUZE/BANDAGES/DRESSINGS) ×2 IMPLANT
DRAPE U-SHAPE 47X51 STRL (DRAPES) ×2 IMPLANT
DRESSING AQUACEL AG SP 3.5X10 (GAUZE/BANDAGES/DRESSINGS) ×2 IMPLANT
DRSG AQUACEL AG ADV 3.5X14 (GAUZE/BANDAGES/DRESSINGS) IMPLANT
DRSG AQUACEL AG SP 3.5X10 (GAUZE/BANDAGES/DRESSINGS) ×1
DURAPREP 26ML APPLICATOR (WOUND CARE) ×4 IMPLANT
ELECT REM PT RETURN 15FT ADLT (MISCELLANEOUS) ×2 IMPLANT
GLOVE BIO SURGEON STRL SZ 6 (GLOVE) ×2 IMPLANT
GLOVE BIOGEL PI IND STRL 6.5 (GLOVE) ×2 IMPLANT
GLOVE BIOGEL PI IND STRL 7.5 (GLOVE) ×2 IMPLANT
GLOVE ORTHO TXT STRL SZ7.5 (GLOVE) ×4 IMPLANT
GOWN STRL REUS W/ TWL LRG LVL3 (GOWN DISPOSABLE) ×4 IMPLANT
GOWN STRL REUS W/TWL LRG LVL3 (GOWN DISPOSABLE) ×2
HANDPIECE INTERPULSE COAX TIP (DISPOSABLE) ×1
HOLDER FOLEY CATH W/STRAP (MISCELLANEOUS) IMPLANT
INSERT MED ATTUNE KNEE 5 7 LT (Insert) IMPLANT
KIT TURNOVER KIT A (KITS) IMPLANT
MANIFOLD NEPTUNE II (INSTRUMENTS) ×2 IMPLANT
NDL SAFETY ECLIP 18X1.5 (MISCELLANEOUS) IMPLANT
NS IRRIG 1000ML POUR BTL (IV SOLUTION) ×2 IMPLANT
PACK TOTAL KNEE CUSTOM (KITS) ×2 IMPLANT
PIN FIX SIGMA LCS THRD HI (PIN) IMPLANT
PROTECTOR NERVE ULNAR (MISCELLANEOUS) ×2 IMPLANT
SET HNDPC FAN SPRY TIP SCT (DISPOSABLE) ×2 IMPLANT
SET PAD KNEE POSITIONER (MISCELLANEOUS) ×2 IMPLANT
SPIKE FLUID TRANSFER (MISCELLANEOUS) ×4 IMPLANT
SUT MNCRL AB 4-0 PS2 18 (SUTURE) ×2 IMPLANT
SUT STRATAFIX PDS+ 0 24IN (SUTURE) ×2 IMPLANT
SUT VIC AB 1 CT1 36 (SUTURE) ×2 IMPLANT
SUT VIC AB 2-0 CT1 27 (SUTURE) ×2
SUT VIC AB 2-0 CT1 TAPERPNT 27 (SUTURE) ×4 IMPLANT
SYR 3ML LL SCALE MARK (SYRINGE) ×2 IMPLANT
TOWEL GREEN STERILE FF (TOWEL DISPOSABLE) ×2 IMPLANT
TRAY FOL W/BAG SLVR 16FR STRL (SET/KITS/TRAYS/PACK) IMPLANT
TRAY FOLEY MTR SLVR 16FR STAT (SET/KITS/TRAYS/PACK) ×2 IMPLANT
TRAY FOLEY W/BAG SLVR 16FR LF (SET/KITS/TRAYS/PACK) ×1
TUBE SUCTION HIGH CAP CLEAR NV (SUCTIONS) ×2 IMPLANT
WATER STERILE IRR 1000ML POUR (IV SOLUTION) ×4 IMPLANT
WRAP KNEE MAXI GEL POST OP (GAUZE/BANDAGES/DRESSINGS) ×2 IMPLANT

## 2023-07-01 NOTE — Transfer of Care (Signed)
Immediate Anesthesia Transfer of Care Note  Patient: Danielle Sexton  Procedure(s) Performed: TOTAL KNEE ARTHROPLASTY (Left: Knee)  Patient Location: PACU  Anesthesia Type:Spinal  Level of Consciousness: awake, alert , oriented, and patient cooperative  Airway & Oxygen Therapy: Patient Spontanous Breathing and Patient connected to face mask oxygen  Post-op Assessment: Report given to RN and Post -op Vital signs reviewed and stable  Post vital signs: Reviewed and stable  Last Vitals:  Vitals Value Taken Time  BP 124/47 07/01/23 0907  Temp    Pulse 70 07/01/23 0908  Resp 17 07/01/23 0908  SpO2 99 % 07/01/23 0908  Vitals shown include unfiled device data.  Last Pain:  Vitals:   07/01/23 0554  TempSrc: Oral         Complications: No notable events documented.

## 2023-07-01 NOTE — Interval H&P Note (Signed)
History and Physical Interval Note:  07/01/2023 7:06 AM  Danielle Sexton  has presented today for surgery, with the diagnosis of Left knee osteoarthritis.  The various methods of treatment have been discussed with the patient and family. After consideration of risks, benefits and other options for treatment, the patient has consented to  Procedure(s): TOTAL KNEE ARTHROPLASTY (Left) as a surgical intervention.  The patient's history has been reviewed, patient examined, no change in status, stable for surgery.  I have reviewed the patient's chart and labs.  Questions were answered to the patient's satisfaction.     [name]

## 2023-07-01 NOTE — Anesthesia Postprocedure Evaluation (Signed)
Anesthesia Post Note  Patient: Danielle Sexton  Procedure(s) Performed: TOTAL KNEE ARTHROPLASTY (Left: Knee)     Patient location during evaluation: PACU Anesthesia Type: Regional, MAC and Spinal Level of consciousness: oriented and awake and alert Pain management: pain level controlled Vital Signs Assessment: post-procedure vital signs reviewed and stable Respiratory status: spontaneous breathing and respiratory function stable Cardiovascular status: blood pressure returned to baseline and stable Postop Assessment: no headache, no backache, spinal receding and no apparent nausea or vomiting Anesthetic complications: no   No notable events documented.  Last Vitals:  Vitals:   07/01/23 0930 07/01/23 0945  BP: (!) 141/56 126/69  Pulse: 69 72  Resp: 17 (!) 22  Temp:    SpO2: 97% 97%    Last Pain:  Vitals:   07/01/23 0907  TempSrc:   PainSc: 0-No pain                 Lannie Fields

## 2023-07-01 NOTE — H&P (Signed)
TOTAL KNEE ADMISSION H&P  Patient is being admitted for left total knee arthroplasty.  Therapy Plans: HHPT then OPPT at [facility]. Disposition: Home with son (through thursday), then sister (thursday), daughter (friday) Planned DVT Prophylaxis: aspirin 81mg  BID DME needed: none PCP: Denita Day (saw a few weeks ago - verbal clearance) TXA: IV Allergies: latex - rash Anesthesia Concerns: none BMI: 33.5 Last HgbA1c: Not diabetic   Other: - Limited help at home, but very motivated - oxycodone, robaxin, tylenol, meloxicam 7.5 - No hx of VTE or cancer - Did well with hip replacement    Subjective:  Chief Complaint:left knee pain.  HPI: Danielle Sexton, 77 y.o. female, has a history of pain and functional disability in the left knee due to arthritis and has failed non-surgical conservative treatments for greater than 12 weeks to includeNSAID's and/or analgesics, corticosteriod injections, and activity modification.  Onset of symptoms was gradual, starting 2 years ago with gradually worsening course since that time. The patient noted no past surgery on the left knee(s).  Patient currently rates pain in the left knee(s) at 8 out of 10 with activity. Patient has worsening of pain with activity and weight bearing and pain that interferes with activities of daily living.  Patient has evidence of joint space narrowing by imaging studies. There is no active infection.  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   Pain in joint of right hip 11/30/2021   HTN (hypertension) 11/25/2021   OA (osteoarthritis) of knee  11/25/2021   Hyperlipidemia 11/25/2021   Allergic rhinitis 11/25/2021   Achilles tendinitis of left lower extremity 11/25/2021   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   Presbycusis of both ears 11/10/2020   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   Past Medical History:  Diagnosis Date   Achilles tendinitis of left lower extremity 11/25/2021   Allergic rhinitis 11/25/2021   Anxiety    GERD (gastroesophageal reflux disease)    Heart murmur    History of hiatal hernia    HTN (hypertension) 11/25/2021   Hyperlipidemia 11/25/2021   OA (osteoarthritis) of hip 11/25/2021   OA (osteoarthritis) of knee 11/25/2021    Past Surgical History:  Procedure Laterality Date   ABDOMINAL HYSTERECTOMY     BREAST EXCISIONAL BIOPSY Right    benign   COLONOSCOPY     ESOPHAGEAL MANOMETRY N/A 01/16/2022   Procedure: ESOPHAGEAL MANOMETRY (EM);  Surgeon: Kerin Salen, MD;  Location: [facility] ENDOSCOPY;  Service: Gastroenterology;  Laterality: N/A;   TOTAL HIP ARTHROPLASTY Right 04/11/2022   Procedure: TOTAL HIP ARTHROPLASTY ANTERIOR APPROACH;  Surgeon: Durene Romans, MD;  Location: [facility] ORS;  Service: Orthopedics;  Laterality: Right;   UPPER GI ENDOSCOPY      Current Facility-Administered Medications  Medication Dose Route Frequency Provider Last Rate Last Admin   ceFAZolin (ANCEF) IVPB 2g/100 mL premix  2 g Intravenous On Call to OR Cassandria Anger, PA-C       dexamethasone (DECADRON) injection 8 mg  8 mg Intravenous Once Cassandria Anger, PA-C       lactated ringers infusion   Intravenous Continuous Cassandria Anger, PA-C  lactated ringers infusion   Intravenous Continuous Ellender, Catheryn Bacon, MD       povidone-iodine 10 % swab 2 Application  2 Application Topical Once Cassandria Anger, PA-C       tranexamic acid (CYKLOKAPRON) IVPB 1,000 mg  1,000 mg Intravenous To OR Cassandria Anger, PA-C       Allergies  Allergen Reactions   Latex Swelling    Social History   Tobacco Use   Smoking status: Never    Smokeless tobacco: Never  Substance Use Topics   Alcohol use: Never    History reviewed. No pertinent family history.   Review of Systems  Constitutional:  Negative for chills and fever.  Respiratory:  Negative for cough and shortness of breath.   Cardiovascular:  Negative for chest pain.  Gastrointestinal:  Negative for nausea and vomiting.  Musculoskeletal:  Positive for arthralgias.     Objective:  Physical Exam Well nourished and well developed. General: Alert and oriented x3, cooperative and pleasant, no acute distress. Head: normocephalic, atraumatic, neck supple. Eyes: EOMI.  Musculoskeletal: Bilateral knee exams: Slight genu valgus to the right knee where slight genu varum on her left knee. Slight flexion contractures Flexion over 110 degrees with mild crepitation and tightness Tenderness at her joint lines Stable medial and lateral collateral ligaments  Calves soft and nontender. Motor function intact in LE. Strength 5/5 LE bilaterally. Neuro: Distal pulses 2+. Sensation to light touch intact in LE.  Vital signs in last 24 hours: Temp:  [98 F (36.7 C)] 98 F (36.7 C) (09/17 0554) Pulse Rate:  [87] 87 (09/17 0554) Resp:  [20] 20 (09/17 0554) BP: (172)/(74) 172/74 (09/17 0554) SpO2:  [99 %] 99 % (09/17 0554) Weight:  [85.7 kg] 85.7 kg (09/17 0529)  Labs:   Estimated body mass index is 32.44 kg/m as calculated from the following:   Height as of this encounter: 5\' 4"  (1.626 m).   Weight as of this encounter: 85.7 kg.   Imaging Review Plain radiographs demonstrate severe degenerative joint disease of the left knee(s). The overall alignment isneutral. The bone quality appears to be adequate for age and reported activity level.      Assessment/Plan:  End stage arthritis, left knee   The patient history, physical examination, clinical judgment of the provider and imaging studies are consistent with end stage degenerative joint disease of the left  knee(s) and total knee arthroplasty is deemed medically necessary. The treatment options including medical management, injection therapy arthroscopy and arthroplasty were discussed at length. The risks and benefits of total knee arthroplasty were presented and reviewed. The risks due to aseptic loosening, infection, stiffness, patella tracking problems, thromboembolic complications and other imponderables were discussed. The patient acknowledged the explanation, agreed to proceed with the plan and consent was signed. Patient is being admitted for inpatient treatment for surgery, pain control, PT, OT, prophylactic antibiotics, VTE prophylaxis, progressive ambulation and ADL's and discharge planning. The patient is planning to be discharged  home.     Patient's anticipated LOS is less than 2 midnights, meeting these requirements: - Younger than 7 - Lives within 1 hour of care - Has a competent adult at home to recover with post-op recover - NO history of  - Chronic pain requiring opiods  - Diabetes  - Coronary Artery Disease  - Heart failure  - Heart attack  - Stroke  - DVT/VTE  - Cardiac arrhythmia  - Respiratory Failure/COPD  - Renal failure  - Anemia  - Advanced  Liver disease  Rosalene Billings, PA-C Orthopedic Surgery [facility] [place] [phone]

## 2023-07-01 NOTE — Anesthesia Procedure Notes (Signed)
Anesthesia Regional Block: Adductor canal block   Pre-Anesthetic Checklist: , timeout performed,  Correct Patient, Correct Site, Correct Laterality,  Correct Procedure, Correct Position, site marked,  Risks and benefits discussed,  Surgical consent,  Pre-op evaluation,  At surgeon's request and post-op pain management  Laterality: Left  Prep: Maximum Sterile Barrier Precautions used, chloraprep       Needles:  Injection technique: Single-shot  Needle Type: Echogenic Stimulator Needle     Needle Length: 9cm  Needle Gauge: 22     Additional Needles:   Procedures:,,,, ultrasound used (permanent image in chart),,    Narrative:  Start time: 07/01/2023 7:00 AM End time: 07/01/2023 7:05 AM Injection made incrementally with aspirations every 5 mL.  Performed by: Personally  Anesthesiologist: Lannie Fields, DO  Additional Notes: Monitors applied. No increased pain on injection. No increased resistance to injection. Injection made in 5cc increments. Good needle visualization. Patient tolerated procedure well.

## 2023-07-01 NOTE — Anesthesia Procedure Notes (Signed)
Spinal  Patient location during procedure: OR Start time: 07/01/2023 7:17 AM End time: 07/01/2023 7:26 AM Reason for block: surgical anesthesia Staffing Performed: anesthesiologist  Anesthesiologist: Lannie Fields, DO Performed by: Lannie Fields, DO Authorized by: Lannie Fields, DO   Preanesthetic Checklist Completed: patient identified, IV checked, risks and benefits discussed, surgical consent, monitors and equipment checked, pre-op evaluation and timeout performed Spinal Block Patient position: sitting Prep: DuraPrep and site prepped and draped Patient monitoring: cardiac monitor, continuous pulse ox and blood pressure Approach: midline Location: L3-4 Injection technique: single-shot Needle Needle type: Pencan  Needle gauge: 24 G Needle length: 10 cm Assessment Sensory level: T6 Events: CSF return Additional Notes Functioning IV was confirmed and monitors were applied. Sterile prep and drape, including hand hygiene and sterile gloves were used. The patient was positioned and the spine was prepped. The skin was anesthetized with lidocaine.  Free flow of clear CSF was obtained prior to injecting local anesthetic into the CSF.  The spinal needle aspirated freely following injection.  The needle was carefully withdrawn.  The patient tolerated the procedure well.   pencan completely hubbed, may potentially need longer needle in the future

## 2023-07-01 NOTE — Evaluation (Signed)
Physical Therapy Evaluation Patient Details Name: Danielle Sexton MRN: [id number] DOB: [birth year] Today's Date: 07/01/2023  History of Present Illness  Pt is 77 yo female admitted on 07/01/23 for L TKA.  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 At baseline, pt is independent and ambulatory without AD.  She lives alone but reports will have family stay with her at d/c. Today, pt with good pain control.  She was able to ambulate 20' with RW and CGA.  Pt did demonstrate some weakness in L knee with difficulty with terminal extension but no buckling. Pt expected to progress well. Pt will benefit from acute skilled PT to increase their independence and safety with mobility to allow discharge.        If plan is discharge home, recommend the following: A little help with walking and/or transfers;A little help with bathing/dressing/bathroom;Assistance with cooking/housework;Help with stairs or ramp for entrance   Can travel by private vehicle        Equipment Recommendations None recommended by PT  Recommendations for Other Services       Functional Status Assessment Patient has had a recent decline in their functional status and demonstrates the ability to make significant improvements in function in a reasonable and predictable amount of time.     Precautions / Restrictions Precautions Precautions: Fall Restrictions Weight Bearing Restrictions: Yes LLE Weight Bearing: Weight bearing as tolerated      Mobility  Bed Mobility Overal bed mobility: Needs Assistance Bed Mobility: Supine to Sit     Supine to sit: Min assist          Transfers Overall transfer level: Needs assistance Equipment used: Rolling walker (2 wheels) Transfers: Sit to/from Stand Sit to Stand: Min assist           General transfer comment: cues for hand  placement and L LE management with increased time and min A to rise    Ambulation/Gait Ambulation/Gait assistance: Contact guard assist Gait Distance (Feet): 20 Feet Assistive device: Rolling walker (2 wheels) Gait Pattern/deviations: Step-to pattern, Decreased stride length, Knee flexed in stance - left, Decreased weight shift to left Gait velocity: decreased     General Gait Details: cues for sequencing , RW proximity, trying to get L knee straight with stance  Stairs            Wheelchair Mobility     Tilt Bed    Modified Rankin (Stroke Patients Only)       Balance Overall balance assessment: Needs assistance Sitting-balance support: No upper extremity supported Sitting balance-Leahy Scale: Good     Standing balance support: Bilateral upper extremity supported, Reliant on assistive device for balance Standing balance-Leahy Scale: Poor                               Pertinent Vitals/Pain Pain Assessment Pain Assessment: 0-10 Pain Score: 2  Pain Location: L knee Pain Descriptors / Indicators: Discomfort Pain Intervention(s): Limited activity within patient's tolerance, Monitored during session, Repositioned, Ice applied (declined pain meds prior)    Home Living Family/patient expects to be discharged to:: Private residence Living Arrangements: Alone Available Help at Discharge: Family;Available 24 hours/day (son, sister, daughter assisting at d/c) Type of Home: Apartment Home Access: Level entry       Home  Layout: One level Home Equipment: Educational psychologist (2 wheels);Cane - single point;Grab bars - tub/shower      Prior Function Prior Level of Function : Independent/Modified Independent;Driving             Mobility Comments: Could ambulate without AD in community ADLs Comments: Pt independent with adls and iadls     Extremity/Trunk Assessment   Upper Extremity Assessment Upper Extremity Assessment: Overall WFL for  tasks assessed    Lower Extremity Assessment Lower Extremity Assessment: LLE deficits/detail;RLE deficits/detail RLE Deficits / Details: ROM WFL; MMT 5/5 RLE Sensation: WNL LLE Deficits / Details: Expected post op changes; ROM: ankle and hip WNL, knee ~10 to 60 degrees limited by pain; MMT: ankle 5/5, knee 2/5, hip flexion and abd 2/5 LLE Sensation: WNL    Cervical / Trunk Assessment Cervical / Trunk Assessment: Normal  Communication      Cognition Arousal: Alert Behavior During Therapy: WFL for tasks assessed/performed Overall Cognitive Status: Within Functional Limits for tasks assessed                                          General Comments General comments (skin integrity, edema, etc.): VSS    Exercises Total Joint Exercises Ankle Circles/Pumps: AROM, Both, 10 reps, Supine Quad Sets: AROM, Both, 5 reps, Supine   Assessment/Plan    PT Assessment Patient needs continued PT services  PT Problem List Decreased strength;Pain;Decreased range of motion;Decreased activity tolerance;Decreased balance;Decreased mobility;Decreased knowledge of use of DME;Decreased knowledge of precautions       PT Treatment Interventions DME instruction;Therapeutic exercise;Gait training;Balance training;Stair training;Modalities;Functional mobility training;Therapeutic activities;Patient/family education    PT Goals (Current goals can be found in the Care Plan section)  Acute Rehab PT Goals Patient Stated Goal: return home PT Goal Formulation: With patient/family Time For Goal Achievement: 07/15/23 Potential to Achieve Goals: Good    Frequency 7X/week     Co-evaluation               AM-PAC PT "6 Clicks" Mobility  Outcome Measure Help needed turning from your back to your side while in a flat bed without using bedrails?: A Little Help needed moving from lying on your back to sitting on the side of a flat bed without using bedrails?: A Little Help needed moving  to and from a bed to a chair (including a wheelchair)?: A Little Help needed standing up from a chair using your arms (e.g., wheelchair or bedside chair)?: A Little Help needed to walk in hospital room?: A Little Help needed climbing 3-5 steps with a railing? : A Lot 6 Click Score: 17    End of Session Equipment Utilized During Treatment: Gait belt Activity Tolerance: Patient tolerated treatment well Patient left: with chair alarm set;in chair;with call bell/phone within reach Nurse Communication: Mobility status PT Visit Diagnosis: Other abnormalities of gait and mobility (R26.89);Muscle weakness (generalized) (M62.81)    Time: 1478-2956 PT Time Calculation (min) (ACUTE ONLY): 23 min   Charges:   PT Evaluation $PT Eval Low Complexity: 1 Low PT Treatments $Gait Training: 8-22 mins PT General Charges $$ ACUTE PT VISIT: 1 Visit         Anise Salvo, PT Acute Rehab [facility] [phone]   Danielle Sexton 07/01/2023, 4:50 PM

## 2023-07-01 NOTE — Discharge Instructions (Signed)

## 2023-07-01 NOTE — Op Note (Signed)
NAME:  Danielle Sexton                      MEDICAL RECORD NO.:  [id number]                             FACILITY:  [facility]      PHYSICIAN:  Madlyn Frankel. Charlann Boxer, M.D.  DATE OF BIRTH:  20-Jul-1946      DATE OF PROCEDURE:  07/01/2023                                     OPERATIVE REPORT         PREOPERATIVE DIAGNOSIS:  Left knee osteoarthritis.      POSTOPERATIVE DIAGNOSIS:  Left knee osteoarthritis.      FINDINGS:  The patient was noted to have complete loss of cartilage and   bone-on-bone arthritis with associated osteophytes in the medial and patellofemoral compartments of   the knee with a significant synovitis and associated effusion.  The patient had failed months of conservative treatment including medications, injection therapy, activity modification.     PROCEDURE:  Left total knee replacement.      COMPONENTS USED:  DePuy Attune FB CR MS knee   system, a size 5N femur, 3 tibia, size 7 mm CR MS AOX insert, and 32 anatomic patellar   button.      SURGEON:  Madlyn Frankel. Charlann Boxer, M.D.      ASSISTANT:  Rosalene Billings, PA-C.      ANESTHESIA:  Regional and Spinal.      SPECIMENS:  None.      COMPLICATION:  None.      DRAINS:  None.  EBL: <100cc      TOURNIQUET TIME:  33 min at 225 mmHg     The patient was stable to the recovery room.      INDICATION FOR PROCEDURE:  Danielle Sexton is a 77 y.o. female patient of   mine.  The patient had been seen, evaluated, and treated for months conservatively in the   office with medication, activity modification, and injections.  The patient had   radiographic changes of bone-on-bone arthritis with endplate sclerosis and osteophytes noted.  Based on the radiographic changes and failed conservative measures, the patient   decided to proceed with definitive treatment, total knee replacement.  Risks of infection, DVT, component failure, need for revision surgery, neurovascular injury were reviewed in the office setting.  The postop course was  reviewed stressing the efforts to maximize post-operative satisfaction and function.  Consent was obtained for benefit of pain   relief.      PROCEDURE IN DETAIL:  The patient was brought to the operative theater.   Once adequate anesthesia, preoperative antibiotics, 2 gm of Ancef,1 gm of Tranexamic Acid, and 10 mg of Decadron administered, the patient was positioned supine with a left thigh tourniquet placed.  The  left lower extremity was prepped and draped in sterile fashion.  A time-   out was performed identifying the patient, planned procedure, and the appropriate extremity.      The left lower extremity was placed in the [facility] leg holder.  The leg was   exsanguinated, tourniquet elevated to 225 mmHg.  A midline incision was   made followed by median parapatellar arthrotomy.  Following initial   exposure, attention was first  directed to the patella.  Precut   measurement was noted to be 21 mm.  I resected down to 13 mm and used a   32 anatomic patellar button to restore patellar height as well as cover the cut surface.      The lug holes were drilled and a metal shim was placed to protect the   patella from retractors and saw blade during the procedure.      At this point, attention was now directed to the femur.  The femoral   canal was opened with a drill, irrigated to try to prevent fat emboli.  An   intramedullary rod was passed at 3 degrees valgus, 9 mm of bone was   resected off the distal femur.  Following this resection, the tibia was   subluxated anteriorly.  Using the extramedullary guide, 2 mm of bone was resected off   the proximal medial tibia.  We confirmed the gap would be   stable medially and laterally with a size 5 spacer block as well as confirmed that the tibial cut was perpendicular in the coronal plane, checking with an alignment rod.      Once this was done, I sized the femur to be a size 5 in the anterior-   posterior dimension, chose a narrow component based  on medial and   lateral dimension.  The size 5 rotation block was then pinned in   position anterior referenced using the C-clamp to set rotation.  The   anterior, posterior, and  chamfer cuts were made without difficulty nor   notching making certain that I was along the anterior cortex to help   with flexion gap stability.      The final box cut was made off the lateral aspect of distal femur.      At this point, the tibia was sized to be a size 3.  The size 3 tray was   then pinned in position through the medial third of the tubercle,   drilled, and keel punched.  Trial reduction was now carried with a 5 femur,  3 tibia, a size 7 mm CR MS insert, and the 32 anatomic patella botton.  The knee was brought to full extension with good flexion stability with the patella   tracking through the trochlea without application of pressure.  Given   all these findings the trial components removed.  Final components were   opened and cement was mixed.  The knee was irrigated with normal saline solution and pulse lavage.  The synovial lining was   then injected with 30 cc of 0.25% Marcaine with epinephrine, 1 cc of Toradol and 30 cc of NS for a total of 61 cc.     Final implants were then cemented onto cleaned and dried cut surfaces of bone with the knee brought to extension with a size 7 mm CR MS trial insert.      Once the cement had fully cured, excess cement was removed   throughout the knee.  I confirmed that I was satisfied with the range of   motion and stability, and the final size 7 mm CR MS AOX insert was chosen.  It was   placed into the knee.      The tourniquet had been let down at 33 minutes.  No significant   hemostasis was required.  The extensor mechanism was then reapproximated using #1 Vicryl and #1 Stratafix sutures with the knee   in flexion.  The   remaining wound was closed with 2-0 Vicryl and running 4-0 Monocryl.   The knee was cleaned, dried, dressed sterilely using  Dermabond and   Aquacel dressing.  The patient was then   brought to recovery room in stable condition, tolerating the procedure   well.   Please note that Physician Assistant, Rosalene Billings, PA-C was present for the entirety of the case, and was utilized for pre-operative positioning, peri-operative retractor management, general facilitation of the procedure and for primary wound closure at the end of the case.              Madlyn Frankel Charlann Boxer, M.D.    07/01/2023 7:06 AM

## 2023-07-02 ENCOUNTER — Encounter (HOSPITAL_COMMUNITY): Payer: Self-pay | Admitting: Orthopedic Surgery

## 2023-07-02 DIAGNOSIS — M1712 Unilateral primary osteoarthritis, left knee: Secondary | ICD-10-CM | POA: Diagnosis not present

## 2023-07-02 MED ORDER — METHOCARBAMOL 500 MG PO TABS: 500.0000 mg | ORAL_TABLET | Freq: Four times a day (QID) | ORAL | 2 refills | Status: AC | PRN

## 2023-07-02 MED ORDER — OXYCODONE HCL 5 MG PO TABS: 5.0000 mg | ORAL_TABLET | ORAL | 0 refills | Status: AC | PRN

## 2023-07-02 MED ORDER — CEFADROXIL 500 MG PO CAPS: 500.0000 mg | ORAL_CAPSULE | Freq: Two times a day (BID) | ORAL | 0 refills | Status: AC

## 2023-07-02 MED ORDER — POLYETHYLENE GLYCOL 3350 17 G PO PACK: 17.0000 g | PACK | Freq: Two times a day (BID) | ORAL | 0 refills | Status: AC

## 2023-07-02 MED ORDER — MELOXICAM 7.5 MG PO TABS: 7.5000 mg | ORAL_TABLET | Freq: Every day | ORAL | 0 refills | Status: AC

## 2023-07-02 MED ORDER — ASPIRIN 81 MG PO CHEW: 81.0000 mg | CHEWABLE_TABLET | Freq: Two times a day (BID) | ORAL | 0 refills | Status: AC

## 2023-07-02 NOTE — TOC Transition Note (Signed)
Transition of Care [facility]) - CM/SW Discharge Note   Patient Details  Name: Danielle Sexton MRN: [id number] Date of Birth: 1946-06-28  [facility] CM/SW Contact:  Amada Jupiter, LCSW Phone Number: 07/02/2023, 10:11 AM   Clinical Narrative:     Met with pt and confirming she has needed DME in the home.  Pt reports OPPT already arranged with [facility]  No TOC needs.  Final next level of care: OP Rehab Barriers to Discharge: No Barriers Identified   Patient Goals and CMS Choice      Discharge Placement                         Discharge Plan and Services Additional resources added to the After Visit Summary for                  DME Arranged: N/A DME Agency: NA                  Social Determinants of Health (SDOH) Interventions SDOH Screenings   Food Insecurity: No Food Insecurity (07/01/2023)  Housing: Low Risk  (07/01/2023)  Transportation Needs: No Transportation Needs (07/01/2023)  Utilities: Not At Risk (07/01/2023)  Tobacco Use: Low Risk  (07/01/2023)     Readmission Risk Interventions     No data to display

## 2023-07-02 NOTE — Progress Notes (Signed)
   Subjective: 1 Day Post-Op Procedure(s) (LRB): TOTAL KNEE ARTHROPLASTY (Left) Patient reports pain as mild.   Patient seen in rounds with Dr. Charlann Boxer. Patient is well, and has had no acute complaints or problems. No acute events overnight. Foley catheter removed. Patient ambulated 20 feet with PT.  We will start therapy today.   Objective: Vital signs in last 24 hours: Temp:  [97.6 F (36.4 C)-98.7 F (37.1 C)] 98.7 F (37.1 C) (09/18 0518) Pulse Rate:  [60-96] 81 (09/18 0518) Resp:  [12-22] 17 (09/18 0518) BP: (115-163)/(47-99) 163/70 (09/18 0518) SpO2:  [95 %-100 %] 99 % (09/18 0518)  Intake/Output from previous day:  Intake/Output Summary (Last 24 hours) at 07/02/2023 0724 Last data filed at 07/02/2023 0200 Gross per 24 hour  Intake 2993.72 ml  Output 3725 ml  Net -731.28 ml     Intake/Output this shift: No intake/output data recorded.  Labs: Recent Labs    07/02/23 0406  HGB 10.4*   Recent Labs    07/02/23 0406  WBC 14.4*  RBC 3.28*  HCT 31.0*  PLT 166   Recent Labs    07/02/23 0406  NA 130*  K 3.5  CL 99  CO2 24  BUN 10  CREATININE 0.61  GLUCOSE 127*  CALCIUM 8.8*   No results for input(s): "LABPT", "INR" in the last 72 hours.  Exam: General - Patient is Alert and Oriented Extremity - Neurologically intact Sensation intact distally Intact pulses distally Dorsiflexion/Plantar flexion intact Dressing - dressing C/D/I Motor Function - intact, moving foot and toes well on exam.   Past Medical History:  Diagnosis Date   Achilles tendinitis of left lower extremity 11/25/2021   Allergic rhinitis 11/25/2021   Anxiety    GERD (gastroesophageal reflux disease)    Heart murmur    History of hiatal hernia    HTN (hypertension) 11/25/2021   Hyperlipidemia 11/25/2021   OA (osteoarthritis) of hip 11/25/2021   OA (osteoarthritis) of knee 11/25/2021    Assessment/Plan: 1 Day Post-Op Procedure(s) (LRB): TOTAL KNEE ARTHROPLASTY (Left) Principal  Problem:   S/P total knee arthroplasty, left  Estimated body mass index is 32.44 kg/m as calculated from the following:   Height as of this encounter: 5\' 4"  (1.626 m).   Weight as of this encounter: 85.7 kg. Advance diet Up with therapy D/C IV fluids   Patient's anticipated LOS is less than 2 midnights, meeting these requirements: - Younger than 63 - Lives within 1 hour of care - Has a competent adult at home to recover with post-op recover - NO history of  - Chronic pain requiring opiods  - Diabetes  - Coronary Artery Disease  - Heart failure  - Heart attack  - Stroke  - DVT/VTE  - Cardiac arrhythmia  - Respiratory Failure/COPD  - Renal failure  - Anemia  - Advanced Liver disease     DVT Prophylaxis - Aspirin Weight bearing as tolerated.  Hgb stable at 10.4 this AM.   Plan is to go Home after hospital stay. Plan for discharge today following 1-2 sessions of PT as long as they are meeting their goals.   Patient is scheduled for OPPT at [facility] on Friday. We decided against HHPT.   Follow up in the office in 2 weeks.   Rosalene Billings, PA-C Orthopedic Surgery [phone] 07/02/2023, 7:24 AM

## 2023-07-02 NOTE — Progress Notes (Signed)
Physical Therapy Treatment Patient Details Name: Danielle Sexton MRN: [id number] DOB: 07-Aug-1946 Today's Date: 07/02/2023   History of Present Illness Pt is 77 yo female admitted on 07/01/23 for L TKA.  Pt with hx including but not limited to chronic pain, obesity, HCL, R THA 6/23, HTN, OA,    PT Comments  Pt is POD # 1 and is progressing well.  She ambulated 100' with RW safely, has good pain control, good quad activation, and support at home.  Pt with good understanding of HEP and no further questions. Pt demonstrates safe gait & transfers in order to return home from PT perspective once discharged by MD.  While in hospital, will continue to benefit from PT for skilled therapy to advance mobility and exercises.       If plan is discharge home, recommend the following: A little help with walking and/or transfers;A little help with bathing/dressing/bathroom;Assistance with cooking/housework;Help with stairs or ramp for entrance   Can travel by private vehicle        Equipment Recommendations  None recommended by PT    Recommendations for Other Services       Precautions / Restrictions Precautions Precautions: Fall Restrictions LLE Weight Bearing: Weight bearing as tolerated     Mobility  Bed Mobility Overal bed mobility: Needs Assistance Bed Mobility: Supine to Sit     Supine to sit: Supervision          Transfers Overall transfer level: Needs assistance Equipment used: Rolling walker (2 wheels) Transfers: Sit to/from Stand Sit to Stand: Supervision, Min assist           General transfer comment: Supervision from recliner and min A from lower bed; cues for L LE management    Ambulation/Gait Ambulation/Gait assistance: Contact guard assist Gait Distance (Feet): 100 Feet Assistive device: Rolling walker (2 wheels) Gait Pattern/deviations: Step-through pattern Gait velocity: decreased     General Gait Details: Good RW proximity; good stability; slight  decrease weight shift to L   Stairs             Wheelchair Mobility     Tilt Bed    Modified Rankin (Stroke Patients Only)       Balance Overall balance assessment: Needs assistance Sitting-balance support: No upper extremity supported Sitting balance-Leahy Scale: Good     Standing balance support: Bilateral upper extremity supported, Reliant on assistive device for balance Standing balance-Leahy Scale: Poor Standing balance comment: RW but stable with RW                            Cognition Arousal: Alert Behavior During Therapy: WFL for tasks assessed/performed Overall Cognitive Status: Within Functional Limits for tasks assessed                                          Exercises Total Joint Exercises Ankle Circles/Pumps: AROM, Both, 10 reps, Supine Quad Sets: AROM, Both, Supine, 10 reps Heel Slides: AAROM, Left, 10 reps, Supine Hip ABduction/ADduction: AAROM, Left, 10 reps, Supine Long Arc Quad: AROM, Left, 10 reps, Seated Knee Flexion: AROM, Left, 10 reps, Seated Goniometric ROM: L knee 0 to 80 Other Exercises Other Exercises: Educate don AAROM techniques and exercise to tolerance    General Comments  Educated on safe ice use, no pivots, car transfers, resting with leg straight, and TED hose during  day. Also, encouraged walking every 1-2 hours during day. Educated on HEP with focus on mobility the first weeks. Discussed doing exercises within pain control and if pain increasing could decreased ROM, reps, and stop exercises as needed. Encouraged to perform quad sets and ankle pumps frequently for blood flow and to promote full knee extension.       Pertinent Vitals/Pain Pain Assessment Pain Assessment: 0-10 Pain Score: 2  Pain Location: L knee Pain Descriptors / Indicators: Discomfort Pain Intervention(s): Limited activity within patient's tolerance, Monitored during session, Ice applied    Home Living                           Prior Function            PT Goals (current goals can now be found in the care plan section) Progress towards PT goals: Progressing toward goals    Frequency    7X/week      PT Plan      Co-evaluation              AM-PAC PT "6 Clicks" Mobility   Outcome Measure  Help needed turning from your back to your side while in a flat bed without using bedrails?: A Little Help needed moving from lying on your back to sitting on the side of a flat bed without using bedrails?: A Little Help needed moving to and from a bed to a chair (including a wheelchair)?: A Little Help needed standing up from a chair using your arms (e.g., wheelchair or bedside chair)?: A Little Help needed to walk in hospital room?: A Little Help needed climbing 3-5 steps with a railing? : A Little 6 Click Score: 18    End of Session Equipment Utilized During Treatment: Gait belt Activity Tolerance: Patient tolerated treatment well Patient left: with chair alarm set;in chair;with call bell/phone within reach Nurse Communication: Mobility status PT Visit Diagnosis: Other abnormalities of gait and mobility (R26.89);Muscle weakness (generalized) (M62.81)     Time: 1610-9604 PT Time Calculation (min) (ACUTE ONLY): 28 min  Charges:    $Gait Training: 8-22 mins $Therapeutic Exercise: 8-22 mins PT General Charges $$ ACUTE PT VISIT: 1 Visit                     Anise Salvo, PT Acute Rehab Services [facility] [phone]    Rayetta Humphrey 07/02/2023, 1:50 PM

## 2023-07-02 NOTE — Care Management Obs Status (Signed)
MEDICARE OBSERVATION STATUS NOTIFICATION   Patient Details  Name: Danielle Sexton MRN: [id number] Date of Birth: 1946/07/15   Medicare Observation Status Notification Given:  Yes    Amada Jupiter, LCSW 07/02/2023, 10:10 AM

## 2023-07-02 NOTE — Plan of Care (Signed)
Problem: Pain Management: Goal: Pain level will decrease with appropriate interventions Outcome: Progressing   Problem: Coping: Goal: Level of anxiety will decrease Outcome: Progressing

## 2023-07-04 ENCOUNTER — Ambulatory Visit: Payer: Medicare HMO | Attending: Student

## 2023-07-04 DIAGNOSIS — Z96652 Presence of left artificial knee joint: Secondary | ICD-10-CM | POA: Diagnosis present

## 2023-07-04 DIAGNOSIS — M25562 Pain in left knee: Secondary | ICD-10-CM | POA: Diagnosis present

## 2023-07-04 DIAGNOSIS — M6281 Muscle weakness (generalized): Secondary | ICD-10-CM | POA: Insufficient documentation

## 2023-07-04 DIAGNOSIS — R2689 Other abnormalities of gait and mobility: Secondary | ICD-10-CM | POA: Diagnosis present

## 2023-07-04 DIAGNOSIS — Z471 Aftercare following joint replacement surgery: Secondary | ICD-10-CM | POA: Insufficient documentation

## 2023-07-04 NOTE — Therapy (Unsigned)
OUTPATIENT PHYSICAL THERAPY LOWER EXTREMITY EVALUATION   Patient Name: Danielle Sexton MRN: [id number] DOB:[birth year], 77 y.o., female Today's Date: 07/08/2023  END OF SESSION:  PT End of Session - 07/08/23 0745     Visit Number 1    Number of Visits 16    Date for PT Re-Evaluation 09/03/23    Authorization Type [employer] MCR    PT Start Time 1345    PT Stop Time 1430    PT Time Calculation (min) 45 min    Activity Tolerance Patient tolerated treatment well;Patient limited by pain    Behavior During Therapy [facility] for tasks assessed/performed             Past Medical History:  Diagnosis Date   Achilles tendinitis of left lower extremity 11/25/2021   Allergic rhinitis 11/25/2021   Anxiety    GERD (gastroesophageal reflux disease)    Heart murmur    History of hiatal hernia    HTN (hypertension) 11/25/2021   Hyperlipidemia 11/25/2021   OA (osteoarthritis) of hip 11/25/2021   OA (osteoarthritis) of knee 11/25/2021   Past Surgical History:  Procedure Laterality Date   ABDOMINAL HYSTERECTOMY     BREAST EXCISIONAL BIOPSY Right    benign   COLONOSCOPY     ESOPHAGEAL MANOMETRY N/A 01/16/2022   Procedure: ESOPHAGEAL MANOMETRY (EM);  Surgeon: Kerin Salen, MD;  Location: [facility];  Service: Gastroenterology;  Laterality: N/A;   TOTAL HIP ARTHROPLASTY Right 04/11/2022   Procedure: TOTAL HIP ARTHROPLASTY ANTERIOR APPROACH;  Surgeon: Durene Romans, MD;  Location: [facility];  Service: Orthopedics;  Laterality: Right;   TOTAL KNEE ARTHROPLASTY Left 07/01/2023   Procedure: TOTAL KNEE ARTHROPLASTY;  Surgeon: Durene Romans, MD;  Location: [facility];  Service: Orthopedics;  Laterality: Left;   UPPER GI ENDOSCOPY     Patient Active Problem List   Diagnosis Date Noted   S/P total knee arthroplasty, left 07/01/2023   Adjustment disorder 05/31/2022   Chronic pain 05/31/2022   Diverticular disease of colon 05/31/2022   Dyslipidemia 05/31/2022   Dysphagia, unspecified 05/31/2022    Esophageal diverticulum 05/31/2022   Morbid obesity (HCC) 05/31/2022   History of colon polyps 05/31/2022   Pure hypercholesterolemia 05/31/2022   S/P total right hip arthroplasty 04/11/2022   Pain in joint of right hip 11/30/2021   HTN (hypertension) 11/25/2021   OA (osteoarthritis) of knee 11/25/2021   Hyperlipidemia 11/25/2021   Allergic rhinitis 11/25/2021   Achilles tendinitis of left lower extremity 11/25/2021   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   Presbycusis of both ears 11/10/2020   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    PCP: Vladimir Crofts, FNP   REFERRING PROVIDER: Cassandria Anger, PA-C  REFERRING DIAG: [phone] (ICD-10-CM) - Pain in left knee  THERAPY DIAG:  Acute pain of left knee  Other abnormalities of gait and mobility  Muscle weakness (generalized)  Aftercare following left knee joint replacement surgery  Rationale for Evaluation and Treatment: Rehabilitation  ONSET DATE: 07/01/23  SUBJECTIVE:   SUBJECTIVE STATEMENT: Underwent L TKA 07/01/23  PERTINENT HISTORY: Pt is 77 yo female admitted on 07/01/23 for L TKA. Pt with hx including but not limited to chronic pain, obesity, HCL, R THA 6/23, HTN, OA, PAIN:  Are you having pain? Yes: NPRS scale: 8/10 Pain location: L knee Pain description: ache, throb Aggravating factors: ROM, activity Relieving factors: medication, rest  PRECAUTIONS: Knee  RED FLAGS: None   WEIGHT BEARING RESTRICTIONS: No  FALLS:  Has patient fallen in last 6 months? No  OCCUPATION: not working  PLOF: Independent with basic ADLs  PATIENT GOALS: To regain my knee function  NEXT MD VISIT: 2 weeks  OBJECTIVE:   DIAGNOSTIC FINDINGS: N/A  PATIENT SURVEYS:  FOTO 17(43 predicted)  MUSCLE LENGTH: deferred  POSTURE: N/A   LOWER EXTREMITY ROM:  Passive ROM Right eval Left eval  Hip flexion    Hip extension    Hip abduction    Hip adduction    Hip internal rotation    Hip external rotation    Knee flexion  65d   Knee extension  -3d  Ankle dorsiflexion    Ankle plantarflexion    Ankle inversion    Ankle eversion     (Blank rows = not tested)  LOWER EXTREMITY MMT:  MMT Right eval Left eval  Hip flexion  3+  Hip extension  3+  Hip abduction  3+  Hip adduction    Hip internal rotation    Hip external rotation    Knee flexion  3  Knee extension  3-  Ankle dorsiflexion    Ankle plantarflexion  4  Ankle inversion    Ankle eversion     (Blank rows = not tested)  LOWER EXTREMITY SPECIAL TESTS:  deferred  FUNCTIONAL TESTS:  30 seconds chair stand test 1 rep  GAIT: Distance walked: 55ft x2 Assistive device utilized: Environmental consultant - 2 wheeled Level of assistance: Modified independence Comments: antalgic    TODAY'S TREATMENT:                                                                                                                              DATE: 07/04/23    PATIENT EDUCATION:  Education details: Discussed eval findings, rehab rationale and POC and patient is in agreement  Person educated: Patient Education method: Explanation Education comprehension: verbalized understanding and needs further education  HOME EXERCISE PROGRAM: Access Code: [email or web address] URL: [email or web address] Date: 07/04/2023 Prepared by: Gustavus Bryant  Exercises - Seated Heel Slide  - 5 x daily - 5 x weekly - 1 sets - 10 reps - Seated Long Arc Quad  - 5 x daily - 5 x weekly - 1 sets - 10 reps - Heel Toe Raises with Counter Support  - 5 x daily - 5 x weekly - 1 sets - 10 reps  ASSESSMENT:  CLINICAL IMPRESSION: Patient is a 77 y.o. female who was seen today for physical therapy evaluation and treatment for L knee pain following TKA 07/01/23. Patient ambulating with a RW, WBAT using step through gait with minimal L flexion in swing through.  Non-removable dressing in place w/o signs of drainage, redness or bruising observed.   ROM and strength deficits present.  Pain levels  controlled.  OBJECTIVE IMPAIRMENTS: Abnormal gait, decreased activity tolerance, decreased coordination, decreased knowledge of condition, decreased knowledge of use of DME, decreased mobility, difficulty walking, decreased ROM, decreased strength, increased fascial restrictions, improper body mechanics, postural dysfunction, obesity, and pain.   ACTIVITY  LIMITATIONS: carrying, lifting, bending, sitting, standing, squatting, sleeping, stairs, transfers, and bed mobility  PERSONAL FACTORS: Age, Fitness, and Past/current experiences are also affecting patient's functional outcome.   REHAB POTENTIAL: Good  CLINICAL DECISION MAKING: Stable/uncomplicated  EVALUATION COMPLEXITY: Low   GOALS: Goals reviewed with patient? Yes  SHORT TERM GOALS: Target date: 08/05/2023   Patient to demonstrate independence in HEP  Baseline: [id number] Goal status: INITIAL  2.  90d PROM knee flexion L Baseline:  Knee flexion  65d  Knee extension  -3d   Goal status: INITIAL  3.  258ft ambulation with LRAD Baseline: 56ft with RW Goal status: INITIAL    LONG TERM GOALS: Target date: 09/02/2023  531ft ambulation with LRAD Baseline: 28ft with RW Goal status: INITIAL  2.  110d PROM knee flexion L Baseline:  Knee flexion  65d  Knee extension  -3d   Goal status: INITIAL  3.  Patient to negotiate 16 steps with most appropriate pattern Baseline: UTA Goal status: INITIAL  4.  4/5 LLE strength throughout Baseline:  MMT Right eval Left eval  Hip flexion  3+  Hip extension  3+  Hip abduction  3+  Hip adduction    Hip internal rotation    Hip external rotation    Knee flexion  3  Knee extension  3-  Ankle dorsiflexion    Ankle plantarflexion  4   Goal status: INITIAL  5.  Patient will score at least 43% on FOTO to signify clinically meaningful improvement in functional abilities.   Baseline: 17% Goal status: INITIAL  6.  Decrease pain to 4/10 at best Baseline: 8/10 at worst Goal  status: INITIAL   PLAN:  PT FREQUENCY: 1-2x/week  PT DURATION: 8 weeks  PLANNED INTERVENTIONS: Therapeutic exercises, Therapeutic activity, Neuromuscular re-education, Balance training, Gait training, Patient/Family education, Self Care, Joint mobilization, Stair training, DME instructions, Aquatic Therapy, Dry Needling, Electrical stimulation, Cryotherapy, Moist heat, Manual therapy, and Re-evaluation  PLAN FOR NEXT SESSION: HEP review and update, manual techniques as appropriate, aerobic tasks, ROM and flexibility activities, strengthening and PREs, TPDN, gait and balance training as needed     Hildred Laser, PT 07/08/2023, 7:48 AM   Referring diagnosis? L TKA Treatment diagnosis? (if different than referring diagnosis) L TKA What was this (referring dx) caused by? [x]  Surgery []  Fall []  Ongoing issue []  Arthritis []  Other: ____________  Laterality: []  Rt [x]  Lt []  Both  Check all possible CPT codes:  *CHOOSE 10 OR LESS*    []  97110 (Therapeutic Exercise)  []  92507 (SLP Treatment)  []  97112 (Neuro Re-ed)   []  92526 (Swallowing Treatment)   []  97116 (Gait Training)   []  44034 (Cognitive Training, 1st 15 minutes) []  97140 (Manual Therapy)   []  97130 (Cognitive Training, each add'l 15 minutes)  []  97164 (Re-evaluation)                              []  Other, List CPT Code ____________  []  97530 (Therapeutic Activities)     []  97535 (Self Care)   [x]  All codes above (97110 - 97535)  []  97012 (Mechanical Traction)  []  97014 (E-stim Unattended)  []  97032 (E-stim manual)  []  97033 (Ionto)  []  97035 (Ultrasound) []  97750 (Physical Performance Training) []  [id number] (Aquatic Therapy) []  97016 (Vasopneumatic Device) []  [id number] (Paraffin) []  97034 (Contrast Bath) []  97597 (Wound Care 1st 20 sq cm) []  97598 (Wound Care each add'l 20 sq cm) []  97760 (  Orthotic Fabrication, Fitting, Training Initial) []  [id number] (Prosthetic Management and Training Initial) []  [id number] (Orthotic or  Prosthetic Training/ Modification Subsequent)

## 2023-07-08 NOTE — Addendum Note (Signed)
Addended by: Hildred Laser on: 07/08/2023 11:32 AM   Modules accepted: Orders

## 2023-07-08 NOTE — Therapy (Unsigned)
OUTPATIENT PHYSICAL THERAPY LOWER EXTREMITY EVALUATION   Patient Name: BRINLEIGH THU MRN: [id number] DOB:[birth year], 77 y.o., female Today's Date: 07/09/2023  END OF SESSION:  PT End of Session - 07/09/23 1320     Visit Number 2    Number of Visits 16    Date for PT Re-Evaluation 09/03/23    Authorization Type [employer] M[employer]    PT Start Time 1315    PT Stop Time 1400    PT Time Calculation (min) 45 min    Activity Tolerance Patient tolerated treatment well;Patient limited by pain    Behavior During Therapy [facility] for tasks assessed/performed              Past Medical History:  Diagnosis Date   Achilles tendinitis of left lower extremity 11/25/2021   Allergic rhinitis 11/25/2021   Anxiety    GERD (gastroesophageal reflux disease)    Heart murmur    History of hiatal hernia    HTN (hypertension) 11/25/2021   Hyperlipidemia 11/25/2021   OA (osteoarthritis) of hip 11/25/2021   OA (osteoarthritis) of knee 11/25/2021   Past Surgical History:  Procedure Laterality Date   ABDOMINAL HYSTERECTOMY     BREAST EXCISIONAL BIOPSY Right    benign   COLONOSCOPY     ESOPHAGEAL MANOMETRY N/A 01/16/2022   Procedure: ESOPHAGEAL MANOMETRY (EM);  Surgeon: Kerin Salen, MD;  Location: [facility];  Service: Gastroenterology;  Laterality: N/A;   TOTAL HIP ARTHROPLASTY Right 04/11/2022   Procedure: TOTAL HIP ARTHROPLASTY ANTERIOR APPROACH;  Surgeon: Durene Romans, MD;  Location: [facility];  Service: Orthopedics;  Laterality: Right;   TOTAL KNEE ARTHROPLASTY Left 07/01/2023   Procedure: TOTAL KNEE ARTHROPLASTY;  Surgeon: Durene Romans, MD;  Location: [facility];  Service: Orthopedics;  Laterality: Left;   UPPER GI ENDOSCOPY     Patient Active Problem List   Diagnosis Date Noted   S/P total knee arthroplasty, 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/2023   Adjustment disorder 05/31/2022   Chronic pain 05/31/2022   Diverticular disease of colon 05/31/2022   Dyslipidemia 05/31/2022   Dysphagia, unspecified 05/31/2022    Esophageal diverticulum 05/31/2022   Morbid obesity (HCC) 05/31/2022   History of colon polyps 05/31/2022   Pure hypercholesterolemia 05/31/2022   S/P total right hip arthroplasty 04/11/2022   Pain in joint of right hip 11/30/2021   HTN (hypertension) 11/25/2021   OA (osteoarthritis) of knee 11/25/2021   Hyperlipidemia 11/25/2021   Allergic rhinitis 11/25/2021   Achilles tendinitis of left lower extremity 11/25/2021   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   Presbycusis of both ears 11/10/2020   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    PCP: Vladimir Crofts, FNP   REFERRING PROVIDER: Cassandria Anger, PA-C  REFERRING DIAG: [id number] (ICD-10-CM) - Pain in left knee  THERAPY DIAG:  Acute pain of left knee  Other abnormalities of gait and mobility  Muscle weakness (generalized)  Rationale for Evaluation and Treatment: Rehabilitation  ONSET DATE: 07/01/23  SUBJECTIVE:   SUBJECTIVE STATEMENT: No issues since initial visit.  Has some concerns about bruising  PERTINENT HISTORY: Pt is 77 yo female admitted on 07/01/23 for L TKA. Pt with hx including but not limited to chronic pain, obesity, HCL, R THA 6/23, HTN, OA, PAIN:  Are you having pain? Yes: NPRS scale: 8/10 Pain location: L knee Pain description: ache, throb Aggravating factors: ROM, activity Relieving factors: medication, rest  PRECAUTIONS: Knee  RED FLAGS: None   WEIGHT BEARING RESTRICTIONS: No  FALLS:  Has patient fallen in last 6 months? No  OCCUPATION: not working  PLOF: Independent with basic ADLs  PATIENT GOALS: To regain my knee function  NEXT MD VISIT: 2 weeks  OBJECTIVE:   DIAGNOSTIC FINDINGS: N/A  PATIENT SURVEYS:  FOTO 17(43 predicted)  MUSCLE LENGTH: deferred  POSTURE: N/A   LOWER EXTREMITY ROM:  Passive ROM Right eval Left eval  Hip flexion    Hip extension    Hip abduction    Hip adduction    Hip internal rotation    Hip external rotation    Knee flexion  65d  Knee extension   -3d  Ankle dorsiflexion    Ankle plantarflexion    Ankle inversion    Ankle eversion     (Blank rows = not tested)  LOWER EXTREMITY MMT:  MMT Right eval Left eval  Hip flexion  3+  Hip extension  3+  Hip abduction  3+  Hip adduction    Hip internal rotation    Hip external rotation    Knee flexion  3  Knee extension  3-  Ankle dorsiflexion    Ankle plantarflexion  4  Ankle inversion    Ankle eversion     (Blank rows = not tested)  LOWER EXTREMITY SPECIAL TESTS:  deferred  FUNCTIONAL TESTS:  30 seconds chair stand test 1 rep  GAIT: Distance walked: 53ft x2 Assistive device utilized: Environmental consultant - 2 wheeled Level of assistance: Modified independence Comments: antalgic    TODAY'S TREATMENT:    [facility] Adult PT Treatment:                                                DATE: 07/09/23 Therapeutic Exercise: Seated knee flexion 15x2 (rapid) Supine SLR L 15x SAQs 2s hold 15x2 Supine heel slides over roll 15x2 ~70d flexion measured FAQs 2# 15 x2 Standing heel/toe 15x Manual Therapy: Posterior tibial mobilization 5x10                                                                                                                            DATE: 07/04/23 Eval and HEP   PATIENT EDUCATION:  Education details: Discussed eval findings, rehab rationale and POC and patient is in agreement  Person educated: Patient Education method: Explanation Education comprehension: verbalized understanding and needs further education  HOME EXERCISE PROGRAM: Access Code: [email or web address] URL: [email or web address] Date: 07/04/2023 Prepared by: Gustavus Bryant  Exercises - Seated Heel Slide  - 5 x daily - 5 x weekly - 1 sets - 10 reps - Seated Long Arc Quad  - 5 x daily - 5 x weekly - 1 sets - 10 reps - Heel Toe Raises with Counter Support  - 5 x daily - 5 x weekly - 1 sets - 10 reps  ASSESSMENT:  CLINICAL IMPRESSION: First f/u session.  Focus was HEP review, quad facilitation and  flexion stretching/ROM.  Able to attain 70d PROM flexion.  Patient is a 77 y.o. female who was seen today for physical therapy evaluation and treatment for L knee pain following TKA 07/01/23. Patient ambulating with a RW, WBAT using step through gait with minimal L flexion in swing through.  Non-removable dressing in place w/o signs of drainage, redness or bruising observed.   ROM and strength deficits present.  Pain levels controlled.  OBJECTIVE IMPAIRMENTS: Abnormal gait, decreased activity tolerance, decreased coordination, decreased knowledge of condition, decreased knowledge of use of DME, decreased mobility, difficulty walking, decreased ROM, decreased strength, increased fascial restrictions, improper body mechanics, postural dysfunction, obesity, and pain.   ACTIVITY LIMITATIONS: carrying, lifting, bending, sitting, standing, squatting, sleeping, stairs, transfers, and bed mobility  PERSONAL FACTORS: Age, Fitness, and Past/current experiences are also affecting patient's functional outcome.   REHAB POTENTIAL: Good  CLINICAL DECISION MAKING: Stable/uncomplicated  EVALUATION COMPLEXITY: Low   GOALS: Goals reviewed with patient? Yes  SHORT TERM GOALS: Target date: 08/05/2023   Patient to demonstrate independence in HEP  Baseline: [id number] Goal status: INITIAL  2.  90d PROM knee flexion L Baseline:  Knee flexion  65d  Knee extension  -3d   Goal status: INITIAL  3.  280ft ambulation with LRAD Baseline: 77ft with RW Goal status: INITIAL    LONG TERM GOALS: Target date: 09/02/2023  541ft ambulation with LRAD Baseline: 59ft with RW Goal status: INITIAL  2.  110d PROM knee flexion L Baseline:  Knee flexion  65d  Knee extension  -3d   Goal status: INITIAL  3.  Patient to negotiate 16 steps with most appropriate pattern Baseline: UTA Goal status: INITIAL  4.  4/5 LLE strength throughout Baseline:  MMT Right eval Left eval  Hip flexion  3+  Hip extension  3+   Hip abduction  3+  Hip adduction    Hip internal rotation    Hip external rotation    Knee flexion  3  Knee extension  3-  Ankle dorsiflexion    Ankle plantarflexion  4   Goal status: INITIAL  5.  Patient will score at least 43% on FOTO to signify clinically meaningful improvement in functional abilities.   Baseline: 17% Goal status: INITIAL  6.  Decrease pain to 4/10 at best Baseline: 8/10 at worst Goal status: INITIAL   PLAN:  PT FREQUENCY: 1-2x/week  PT DURATION: 8 weeks  PLANNED INTERVENTIONS: Therapeutic exercises, Therapeutic activity, Neuromuscular re-education, Balance training, Gait training, Patient/Family education, Self Care, Joint mobilization, Stair training, DME instructions, Aquatic Therapy, Dry Needling, Electrical stimulation, Cryotherapy, Moist heat, Manual therapy, and Re-evaluation  PLAN FOR NEXT SESSION: HEP review and update, manual techniques as appropriate, aerobic tasks, ROM and flexibility activities, strengthening and PREs, TPDN, gait and balance training as needed     Hildred Laser, PT 07/09/2023, 1:58 PM   Referring diagnosis? L TKA Treatment diagnosis? (if different than referring diagnosis) L TKA What was this (referring dx) caused by? [x]  Surgery []  Fall []  Ongoing issue []  Arthritis []  Other: ____________  Laterality: []  Rt [x]  Lt []  Both  Check all possible CPT codes:  *CHOOSE 10 OR LESS*    []  13086 (Therapeutic Exercise)  []  92507 (SLP Treatment)  []  57846 (Neuro Re-ed)   []  92526 (Swallowing Treatment)   []  96295 (Gait Training)   []  [id number] (Cognitive Training, 1st 15 minutes) []  97140 (Manual Therapy)   []  97130 (Cognitive Training, each add'l 15 minutes)  []  97164 (Re-evaluation)                              []   Other, List CPT Code ____________  []  97530 (Therapeutic Activities)     []  97535 (Self Care)   [x]  All codes above (97110 - 97535)  []  97012 (Mechanical Traction)  []  97014 (E-stim Unattended)  []  97032  (E-stim manual)  []  97033 (Ionto)  []  97035 (Ultrasound) []  97750 (Physical Performance Training) []  [id number] (Aquatic Therapy) []  97016 (Vasopneumatic Device) []  [id number] (Paraffin) []  97034 (Contrast Bath) []  29528 (Wound Care 1st 20 sq cm) []  97598 (Wound Care each add'l 20 sq cm) []  97760 (Orthotic Fabrication, Fitting, Training Initial) []  [id number] (Prosthetic Management and Training Initial) []  [phone] (Orthotic or Prosthetic Training/ Modification Subsequent)

## 2023-07-09 ENCOUNTER — Ambulatory Visit: Payer: Medicare HMO

## 2023-07-09 DIAGNOSIS — R2689 Other abnormalities of gait and mobility: Secondary | ICD-10-CM

## 2023-07-09 DIAGNOSIS — M25562 Pain in left knee: Secondary | ICD-10-CM

## 2023-07-09 DIAGNOSIS — M6281 Muscle weakness (generalized): Secondary | ICD-10-CM

## 2023-07-11 ENCOUNTER — Encounter: Payer: Medicare HMO | Admitting: Physical Therapy

## 2023-07-14 NOTE — Therapy (Unsigned)
OUTPATIENT PHYSICAL THERAPY LOWER EXTREMITY EVALUATION   Patient Name: Danielle Sexton MRN: [id number] DOB:08/28/1946, 77 y.o., female Today's Date: 07/14/2023  END OF SESSION:     Past Medical History:  Diagnosis Date   Achilles tendinitis of left lower extremity 11/25/2021   Allergic rhinitis 11/25/2021   Anxiety    GERD (gastroesophageal reflux disease)    Heart murmur    History of hiatal hernia    HTN (hypertension) 11/25/2021   Hyperlipidemia 11/25/2021   OA (osteoarthritis) of hip 11/25/2021   OA (osteoarthritis) of knee 11/25/2021   Past Surgical History:  Procedure Laterality Date   ABDOMINAL HYSTERECTOMY     BREAST EXCISIONAL BIOPSY Right    benign   COLONOSCOPY     ESOPHAGEAL MANOMETRY N/A 01/16/2022   Procedure: ESOPHAGEAL MANOMETRY (EM);  Surgeon: Kerin Salen, MD;  Location: [facility];  Service: Gastroenterology;  Laterality: N/A;   TOTAL HIP ARTHROPLASTY Right 04/11/2022   Procedure: TOTAL HIP ARTHROPLASTY ANTERIOR APPROACH;  Surgeon: Durene Romans, MD;  Location: [facility];  Service: Orthopedics;  Laterality: Right;   TOTAL KNEE ARTHROPLASTY Left 07/01/2023   Procedure: TOTAL KNEE ARTHROPLASTY;  Surgeon: Durene Romans, MD;  Location: [facility];  Service: Orthopedics;  Laterality: Left;   UPPER GI ENDOSCOPY     Patient Active Problem List   Diagnosis Date Noted   S/P total knee arthroplasty, left 07/01/2023   Adjustment disorder 05/31/2022   Chronic pain 05/31/2022   Diverticular disease of colon 05/31/2022   Dyslipidemia 05/31/2022   Dysphagia, unspecified 05/31/2022   Esophageal diverticulum 05/31/2022   Morbid obesity (HCC) 05/31/2022   History of colon polyps 05/31/2022   Pure hypercholesterolemia 05/31/2022   S/P total right hip arthroplasty 04/11/2022   Pain in joint of right hip 11/30/2021   HTN (hypertension) 11/25/2021   OA (osteoarthritis) of knee 11/25/2021   Hyperlipidemia 11/25/2021   Allergic rhinitis 11/25/2021   Achilles tendinitis  of left lower extremity 11/25/2021   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   Presbycusis of both ears 11/10/2020   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    PCP: Vladimir Crofts, FNP   REFERRING PROVIDER: Cassandria Anger, PA-C  REFERRING DIAG: [phone] (ICD-10-CM) - Pain in left knee  THERAPY DIAG:  No diagnosis found.  Rationale for Evaluation and Treatment: Rehabilitation  ONSET DATE: 07/01/23  SUBJECTIVE:   SUBJECTIVE STATEMENT: No issues since initial visit.  Has some concerns about bruising  PERTINENT HISTORY: Pt is 77 yo female admitted on 07/01/23 for L TKA. Pt with hx including but not limited to chronic pain, obesity, HCL, R THA 6/23, HTN, OA, PAIN:  Are you having pain? Yes: NPRS scale: 8/10 Pain location: L knee Pain description: ache, throb Aggravating factors: ROM, activity Relieving factors: medication, rest  PRECAUTIONS: Knee  RED FLAGS: None   WEIGHT BEARING RESTRICTIONS: No  FALLS:  Has patient fallen in last 6 months? No  OCCUPATION: not working  PLOF: Independent with basic ADLs  PATIENT GOALS: To regain my knee function  NEXT MD VISIT: 2 weeks  OBJECTIVE:   DIAGNOSTIC FINDINGS: N/A  PATIENT SURVEYS:  FOTO 17(43 predicted)  MUSCLE LENGTH: deferred  POSTURE: N/A   LOWER EXTREMITY ROM:  Passive ROM Right eval Left eval  Hip flexion    Hip extension    Hip abduction    Hip adduction    Hip internal rotation    Hip external rotation    Knee flexion  65d  Knee extension  -3d  Ankle dorsiflexion  Ankle plantarflexion    Ankle inversion    Ankle eversion     (Blank rows = not tested)  LOWER EXTREMITY MMT:  MMT Right eval Left eval  Hip flexion  3+  Hip extension  3+  Hip abduction  3+  Hip adduction    Hip internal rotation    Hip external rotation    Knee flexion  3  Knee extension  3-  Ankle dorsiflexion    Ankle plantarflexion  4  Ankle inversion    Ankle eversion     (Blank rows = not tested)  LOWER EXTREMITY  SPECIAL TESTS:  deferred  FUNCTIONAL TESTS:  30 seconds chair stand test 1 rep  GAIT: Distance walked: 33ft x2 Assistive device utilized: Environmental consultant - 2 wheeled Level of assistance: Modified independence Comments: antalgic    TODAY'S TREATMENT:    [facility] Adult PT Treatment:                                                DATE: 07/09/23 Therapeutic Exercise: Seated knee flexion 15x2 (rapid) Supine SLR L 15x SAQs 2s hold 15x2 Supine heel slides over roll 15x2 ~70d flexion measured FAQs 2# 15 x2 Standing heel/toe 15x Manual Therapy: Posterior tibial mobilization 5x10                                                                                                                            DATE: 07/04/23 Eval and HEP   PATIENT EDUCATION:  Education details: Discussed eval findings, rehab rationale and POC and patient is in agreement  Person educated: Patient Education method: Explanation Education comprehension: verbalized understanding and needs further education  HOME EXERCISE PROGRAM: Access Code: [email or web address] URL: [email or web address] Date: 07/04/2023 Prepared by: Gustavus Bryant  Exercises - Seated Heel Slide  - 5 x daily - 5 x weekly - 1 sets - 10 reps - Seated Long Arc Quad  - 5 x daily - 5 x weekly - 1 sets - 10 reps - Heel Toe Raises with Counter Support  - 5 x daily - 5 x weekly - 1 sets - 10 reps  ASSESSMENT:  CLINICAL IMPRESSION: First f/u session.  Focus was HEP review, quad facilitation and flexion stretching/ROM.  Able to attain 70d PROM flexion.  Patient is a 77 y.o. female who was seen today for physical therapy evaluation and treatment for L knee pain following TKA 07/01/23. Patient ambulating with a RW, WBAT using step through gait with minimal L flexion in swing through.  Non-removable dressing in place w/o signs of drainage, redness or bruising observed.   ROM and strength deficits present.  Pain levels controlled.  OBJECTIVE IMPAIRMENTS: Abnormal  gait, decreased activity tolerance, decreased coordination, decreased knowledge of condition, decreased knowledge of use of DME, decreased mobility, difficulty walking, decreased ROM, decreased strength, increased fascial restrictions, improper  body mechanics, postural dysfunction, obesity, and pain.   ACTIVITY LIMITATIONS: carrying, lifting, bending, sitting, standing, squatting, sleeping, stairs, transfers, and bed mobility  PERSONAL FACTORS: Age, Fitness, and Past/current experiences are also affecting patient's functional outcome.   REHAB POTENTIAL: Good  CLINICAL DECISION MAKING: Stable/uncomplicated  EVALUATION COMPLEXITY: Low   GOALS: Goals reviewed with patient? Yes  SHORT TERM GOALS: Target date: 08/05/2023   Patient to demonstrate independence in HEP  Baseline: [id number] Goal status: INITIAL  2.  90d PROM knee flexion L Baseline:  Knee flexion  65d  Knee extension  -3d   Goal status: INITIAL  3.  278ft ambulation with LRAD Baseline: 57ft with RW Goal status: INITIAL    LONG TERM GOALS: Target date: 09/02/2023  560ft ambulation with LRAD Baseline: 83ft with RW Goal status: INITIAL  2.  110d PROM knee flexion L Baseline:  Knee flexion  65d  Knee extension  -3d   Goal status: INITIAL  3.  Patient to negotiate 16 steps with most appropriate pattern Baseline: UTA Goal status: INITIAL  4.  4/5 LLE strength throughout Baseline:  MMT Right eval Left eval  Hip flexion  3+  Hip extension  3+  Hip abduction  3+  Hip adduction    Hip internal rotation    Hip external rotation    Knee flexion  3  Knee extension  3-  Ankle dorsiflexion    Ankle plantarflexion  4   Goal status: INITIAL  5.  Patient will score at least 43% on FOTO to signify clinically meaningful improvement in functional abilities.   Baseline: 17% Goal status: INITIAL  6.  Decrease pain to 4/10 at best Baseline: 8/10 at worst Goal status: INITIAL   PLAN:  PT FREQUENCY:  1-2x/week  PT DURATION: 8 weeks  PLANNED INTERVENTIONS: Therapeutic exercises, Therapeutic activity, Neuromuscular re-education, Balance training, Gait training, Patient/Family education, Self Care, Joint mobilization, Stair training, DME instructions, Aquatic Therapy, Dry Needling, Electrical stimulation, Cryotherapy, Moist heat, Manual therapy, and Re-evaluation  PLAN FOR NEXT SESSION: HEP review and update, manual techniques as appropriate, aerobic tasks, ROM and flexibility activities, strengthening and PREs, TPDN, gait and balance training as needed     Hildred Laser, PT 07/14/2023, 1:54 PM   Referring diagnosis? L TKA Treatment diagnosis? (if different than referring diagnosis) L TKA What was this (referring dx) caused by? [x]  Surgery []  Fall []  Ongoing issue []  Arthritis []  Other: ____________  Laterality: []  Rt [x]  Lt []  Both  Check all possible CPT codes:  *CHOOSE 10 OR LESS*    []  97110 (Therapeutic Exercise)  []  92507 (SLP Treatment)  []  97112 (Neuro Re-ed)   []  92526 (Swallowing Treatment)   []  47829 (Gait Training)   []  [id number] (Cognitive Training, 1st 15 minutes) []  97140 (Manual Therapy)   []  97130 (Cognitive Training, each add'l 15 minutes)  []  97164 (Re-evaluation)                              []  Other, List CPT Code ____________  []  97530 (Therapeutic Activities)     []  97535 (Self Care)   [x]  All codes above (97110 - 97535)  []  97012 (Mechanical Traction)  []  97014 (E-stim Unattended)  []  97032 (E-stim manual)  []  97033 (Ionto)  []  97035 (Ultrasound) []  97750 (Physical Performance Training) []  [id number] (Aquatic Therapy) []  97016 (Vasopneumatic Device) []  [id number] (Paraffin) []  97034 (Contrast Bath) []  97597 (Wound Care 1st 20 sq cm) []   [phone] (Wound Care each add'l 20 sq cm) []  97760 (Orthotic Fabrication, Fitting, Training Initial) []  [id number] (Prosthetic Management and Training Initial) []  [id number] (Orthotic or Prosthetic Training/ Modification Subsequent)

## 2023-07-15 NOTE — Discharge Summary (Signed)
Patient ID: Danielle Sexton MRN: [id number] DOB/AGE: 1946/09/09 77 y.o.  Admit date: 07/01/2023 Discharge date: 07/02/2023  Admission Diagnoses:  Left knee osteoarthritis  Discharge Diagnoses:  Principal Problem:   S/P total knee arthroplasty, left   Past Medical History:  Diagnosis Date   Achilles tendinitis of left lower extremity 11/25/2021   Allergic rhinitis 11/25/2021   Anxiety    GERD (gastroesophageal reflux disease)    Heart murmur    History of hiatal hernia    HTN (hypertension) 11/25/2021   Hyperlipidemia 11/25/2021   OA (osteoarthritis) of hip 11/25/2021   OA (osteoarthritis) of knee 11/25/2021    Surgeries: Procedure(s): TOTAL KNEE ARTHROPLASTY on 07/01/2023   Consultants:   Discharged Condition: Improved  Hospital Course: Danielle Sexton is an 77 y.o. female who was admitted 07/01/2023 for operative treatment ofS/P total knee arthroplasty, left. Patient has severe unremitting pain that affects sleep, daily activities, and work/hobbies. After pre-op clearance the patient was taken to the operating room on 07/01/2023 and underwent  Procedure(s): TOTAL KNEE ARTHROPLASTY.    Patient was given perioperative antibiotics:  Anti-infectives (From admission, onward)    Start     Dose/Rate Route Frequency Ordered Stop   07/02/23 0000  cefadroxil (DURICEF) 500 MG capsule        500 mg Oral 2 times daily 07/02/23 0732 07/09/23 2359   07/01/23 1330  ceFAZolin (ANCEF) IVPB 2g/100 mL premix        2 g 200 mL/hr over 30 Minutes Intravenous Every 6 hours 07/01/23 1115 07/01/23 2239   07/01/23 0600  ceFAZolin (ANCEF) IVPB 2g/100 mL premix        2 g 200 mL/hr over 30 Minutes Intravenous On call to O.R. 07/01/23 0529 07/01/23 0745        Patient was given sequential compression devices, early ambulation, and chemoprophylaxis to prevent DVT. Patient worked with PT and was meeting their goals regarding safe ambulation and transfers.  Patient benefited maximally from  hospital stay and there were no complications.    Recent vital signs: No data found.   Recent laboratory studies: No results for input(s): "WBC", "HGB", "HCT", "PLT", "NA", "K", "CL", "CO2", "BUN", "CREATININE", "GLUCOSE", "INR", "CALCIUM" in the last 72 hours.  Invalid input(s): "PT", "2"   Discharge Medications:   Allergies as of 07/02/2023       Reactions   Latex Swelling        Medication List     TAKE these medications    acetaminophen 650 MG CR tablet Commonly known as: TYLENOL Take 1,300 mg by mouth every 8 (eight) hours as needed for pain.   alum hydroxide-mag trisilicate 80-20 MG Chew chewable tablet Commonly known as: GAVISCON Chew 2 tablets by mouth 3 (three) times daily as needed for indigestion or heartburn.   amLODipine 10 MG tablet Commonly known as: NORVASC Take 10 mg by mouth daily.   aspirin 81 MG chewable tablet Chew 1 tablet (81 mg total) by mouth 2 (two) times daily for 28 days.   busPIRone 7.5 MG tablet Commonly known as: BUSPAR Take 7.5 mg by mouth 2 (two) times daily.   docusate sodium 100 MG capsule Commonly known as: COLACE Take 1 capsule (100 mg total) by mouth 2 (two) times daily. What changed:  when to take this reasons to take this   ELDERBERRY PO Take 2 capsules by mouth daily.   Ferrous Sulfate Dried 45 MG Tbcr Take 45 mg by mouth daily.   fexofenadine 180 MG tablet Commonly  known as: ALLEGRA Take 180 mg by mouth daily as needed for allergies.   Fish Oil 1000 MG Caps Take 1,000 mg by mouth every Monday, Wednesday, and Friday.   fluticasone 50 MCG/ACT nasal spray Commonly known as: FLONASE Place 1 spray into both nostrils daily as needed for allergies.   hydrALAZINE 50 MG tablet Commonly known as: APRESOLINE Take 50 mg by mouth 2 (two) times daily.   meloxicam 7.5 MG tablet Commonly known as: MOBIC Take 1 tablet (7.5 mg total) by mouth daily.   METAMUCIL FIBER PO Take 2 capsules by mouth daily.   methocarbamol  500 MG tablet Commonly known as: ROBAXIN Take 1 tablet (500 mg total) by mouth every 6 (six) hours as needed for muscle spasms (muscle pain).   multivitamin with minerals Tabs tablet Take 1 tablet by mouth every Monday, Wednesday, and Friday.   oxyCODONE 5 MG immediate release tablet Commonly known as: Oxy IR/ROXICODONE Take 1 tablet (5 mg total) by mouth every 4 (four) hours as needed for severe pain.   pantoprazole 40 MG tablet Commonly known as: PROTONIX Take 40 mg by mouth daily as needed (acid reflux).   polyethylene glycol 17 g packet Commonly known as: MIRALAX / GLYCOLAX Take 17 g by mouth 2 (two) times daily.   telmisartan 80 MG tablet Commonly known as: MICARDIS Take 80 mg by mouth daily.       ASK your doctor about these medications    cefadroxil 500 MG capsule Commonly known as: DURICEF Take 1 capsule (500 mg total) by mouth 2 (two) times daily for 7 days. Ask about: Should I take this medication?               Discharge Care Instructions  (From admission, onward)           Start     Ordered   07/02/23 0000  Change dressing       Comments: Maintain surgical dressing until follow up in the clinic. If the edges start to pull up, may reinforce with tape. If the dressing is no longer working, may remove and cover with gauze and tape, but must keep the area dry and clean.  Call with any questions or concerns.   07/02/23 0727            Diagnostic Studies: No results found.  Disposition: Discharge disposition: 01-Home or Self Care       Discharge Instructions     Call MD / Call 911   Complete by: As directed    If you experience chest pain or shortness of breath, CALL 911 and be transported to the hospital emergency room.  If you develope a fever above 101 F, pus (white drainage) or increased drainage or redness at the wound, or calf pain, call your surgeon's office.   Change dressing   Complete by: As directed    Maintain surgical  dressing until follow up in the clinic. If the edges start to pull up, may reinforce with tape. If the dressing is no longer working, may remove and cover with gauze and tape, but must keep the area dry and clean.  Call with any questions or concerns.   Constipation Prevention   Complete by: As directed    Drink plenty of fluids.  Prune juice may be helpful.  You may use a stool softener, such as Colace (over the counter) 100 mg twice a day.  Use MiraLax (over the counter) for constipation as needed.   Diet -  low sodium heart healthy   Complete by: As directed    Increase activity slowly as tolerated   Complete by: As directed    Weight bearing as tolerated with assist device (walker, cane, etc) as directed, use it as long as suggested by your surgeon or therapist, typically at least 4-6 weeks.   Post-operative opioid taper instructions:   Complete by: As directed    POST-OPERATIVE OPIOID TAPER INSTRUCTIONS: It is important to wean off of your opioid medication as soon as possible. If you do not need pain medication after your surgery it is ok to stop day one. Opioids include: Codeine, Hydrocodone(Norco, Vicodin), Oxycodone(Percocet, oxycontin) and hydromorphone amongst others.  Long term and even short term use of opiods can cause: Increased pain response Dependence Constipation Depression Respiratory depression And more.  Withdrawal symptoms can include Flu like symptoms Nausea, vomiting And more Techniques to manage these symptoms Hydrate well Eat regular healthy meals Stay active Use relaxation techniques(deep breathing, meditating, yoga) Do Not substitute Alcohol to help with tapering If you have been on opioids for less than two weeks and do not have pain than it is ok to stop all together.  Plan to wean off of opioids This plan should start within one week post op of your joint replacement. Maintain the same interval or time between taking each dose and first decrease the  dose.  Cut the total daily intake of opioids by one tablet each day Next start to increase the time between doses. The last dose that should be eliminated is the evening dose.      TED hose   Complete by: As directed    Use stockings (TED hose) for 2 weeks on both leg(s).  You may remove them at night for sleeping.        Follow-up Information     Durene Romans, MD. Schedule an appointment as soon as possible for a visit in 2 week(s).   Specialty: Orthopedic Surgery Contact information: [address] [place] [address] [place] [address] [phone]                  Signed: Cassandria Anger 07/15/2023, 7:18 AM

## 2023-07-17 ENCOUNTER — Ambulatory Visit: Payer: Medicare HMO | Attending: Student

## 2023-07-17 DIAGNOSIS — M25562 Pain in left knee: Secondary | ICD-10-CM

## 2023-07-17 DIAGNOSIS — Z471 Aftercare following joint replacement surgery: Secondary | ICD-10-CM | POA: Diagnosis present

## 2023-07-17 DIAGNOSIS — R2689 Other abnormalities of gait and mobility: Secondary | ICD-10-CM | POA: Diagnosis present

## 2023-07-17 DIAGNOSIS — Z96652 Presence of left artificial knee joint: Secondary | ICD-10-CM | POA: Diagnosis present

## 2023-07-17 DIAGNOSIS — M6281 Muscle weakness (generalized): Secondary | ICD-10-CM

## 2023-07-21 ENCOUNTER — Ambulatory Visit: Payer: Medicare HMO

## 2023-07-30 ENCOUNTER — Ambulatory Visit: Payer: Medicare HMO

## 2023-07-30 DIAGNOSIS — R2689 Other abnormalities of gait and mobility: Secondary | ICD-10-CM

## 2023-07-30 DIAGNOSIS — M25562 Pain in left knee: Secondary | ICD-10-CM | POA: Diagnosis not present

## 2023-07-30 DIAGNOSIS — M6281 Muscle weakness (generalized): Secondary | ICD-10-CM

## 2023-07-30 NOTE — Therapy (Signed)
OUTPATIENT PHYSICAL THERAPY TREATMENT   Patient Name: Danielle Sexton MRN: [id number] DOB:06/21/1946, 77 y.o., female Today's Date: 07/30/2023  END OF SESSION:  PT End of Session - 07/30/23 1109     Visit Number 4    Number of Visits 16    Date for PT Re-Evaluation 09/03/23    Authorization Type [employer] M[employer]    PT Start Time 1145    PT Stop Time 1225    PT Time Calculation (min) 40 min    Activity Tolerance Patient tolerated treatment well;Patient limited by pain    Behavior During Therapy [facility] for tasks assessed/performed                Past Medical History:  Diagnosis Date   Achilles tendinitis of left lower extremity 11/25/2021   Allergic rhinitis 11/25/2021   Anxiety    GERD (gastroesophageal reflux disease)    Heart murmur    History of hiatal hernia    HTN (hypertension) 11/25/2021   Hyperlipidemia 11/25/2021   OA (osteoarthritis) of hip 11/25/2021   OA (osteoarthritis) of knee 11/25/2021   Past Surgical History:  Procedure Laterality Date   ABDOMINAL HYSTERECTOMY     BREAST EXCISIONAL BIOPSY Right    benign   COLONOSCOPY     ESOPHAGEAL MANOMETRY N/A 01/16/2022   Procedure: ESOPHAGEAL MANOMETRY (EM);  Surgeon: Kerin Salen, MD;  Location: [facility];  Service: Gastroenterology;  Laterality: N/A;   TOTAL HIP ARTHROPLASTY Right 04/11/2022   Procedure: TOTAL HIP ARTHROPLASTY ANTERIOR APPROACH;  Surgeon: Durene Romans, MD;  Location: [facility];  Service: Orthopedics;  Laterality: Right;   TOTAL KNEE ARTHROPLASTY Left 07/01/2023   Procedure: TOTAL KNEE ARTHROPLASTY;  Surgeon: Durene Romans, MD;  Location: [facility];  Service: Orthopedics;  Laterality: Left;   UPPER GI ENDOSCOPY     Patient Active Problem List   Diagnosis Date Noted   S/P total knee arthroplasty, left 07/01/2023   Adjustment disorder 05/31/2022   Chronic pain 05/31/2022   Diverticular disease of colon 05/31/2022   Dyslipidemia 05/31/2022   Dysphagia, unspecified 05/31/2022   Esophageal  diverticulum 05/31/2022   Morbid obesity (HCC) 05/31/2022   History of colon polyps 05/31/2022   Pure hypercholesterolemia 05/31/2022   S/P total right hip arthroplasty 04/11/2022   Pain in joint of right hip 11/30/2021   HTN (hypertension) 11/25/2021   OA (osteoarthritis) of knee 11/25/2021   Hyperlipidemia 11/25/2021   Allergic rhinitis 11/25/2021   Achilles tendinitis of left lower extremity 11/25/2021   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   Presbycusis of both ears 11/10/2020   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    PCP: Vladimir Crofts, FNP   REFERRING PROVIDER: Cassandria Anger, PA-C  REFERRING DIAG: [phone] (ICD-10-CM) - Pain in left knee  THERAPY DIAG:  Acute pain of left knee  Other abnormalities of gait and mobility  Muscle weakness (generalized)  Rationale for Evaluation and Treatment: Rehabilitation  ONSET DATE: 07/01/23  SUBJECTIVE:   SUBJECTIVE STATEMENT:  Pt presents to PT with reports of continued L knee stiffness. Has continued HEP compliance with with continued pain.    PERTINENT HISTORY: Pt is 77 yo female admitted on 07/01/23 for L TKA. Pt with hx including but not limited to chronic pain, obesity, HCL, R THA 6/23, HTN, OA,  PAIN:  Are you having pain?  Yes: NPRS scale: 8/10 Pain location: L knee Pain description: ache, throb Aggravating factors: ROM, activity Relieving factors: medication, rest  PRECAUTIONS: Knee  RED FLAGS: None   WEIGHT BEARING  RESTRICTIONS: No  FALLS:  Has patient fallen in last 6 months? No  OCCUPATION: not working  PLOF: Independent with basic ADLs  PATIENT GOALS: To regain my knee function  NEXT MD VISIT: 2 weeks  OBJECTIVE:   DIAGNOSTIC FINDINGS: N/A  PATIENT SURVEYS:  FOTO 17(43 predicted)  MUSCLE LENGTH: deferred  POSTURE: N/A   LOWER EXTREMITY ROM:  Passive ROM Right eval Left eval Left 07/30/23  Hip flexion     Hip extension     Hip abduction     Hip adduction     Hip internal rotation      Hip external rotation     Knee flexion  65d 91 passive  Knee extension  -3d 0  Ankle dorsiflexion     Ankle plantarflexion     Ankle inversion     Ankle eversion      (Blank rows = not tested)  LOWER EXTREMITY MMT:  MMT Right eval Left eval  Hip flexion  3+  Hip extension  3+  Hip abduction  3+  Hip adduction    Hip internal rotation    Hip external rotation    Knee flexion  3  Knee extension  3-  Ankle dorsiflexion    Ankle plantarflexion  4  Ankle inversion    Ankle eversion     (Blank rows = not tested)  LOWER EXTREMITY SPECIAL TESTS:  deferred  FUNCTIONAL TESTS:  30 seconds chair stand test 1 rep  GAIT: Distance walked: 32ft x2 Assistive device utilized: Environmental consultant - 2 wheeled Level of assistance: Modified independence Comments: antalgic    TODAY'S TREATMENT:    [facility] Adult PT Treatment:                                                DATE: 07/30/23 Therapeutic Exercise: NuStep lvl 5 UE/LE x 4 min while taking subjective Seated heel slide 2x10 - 5" hold R Supine heel slide with strap 2x10 - 5" hold PT assist heel slide L  Supine QS L heel on block x 10 - 5" hold Supine SLR 2x10 2# L LAQ L 2x10 2# Standing mini squat x 10 with UE suport  [facility] Adult PT Treatment:                                                DATE: 07/17/23 Therapeutic Exercise: Seated knee flexion 15x2 (rapid) 2# Supine SLR L 15x SAQs 2s hold 15x2 2# Supine heel slides over roll 15x2 ~82d flexion measured, -2-3d extension FAQs 2# 15 x2 Standing heel/toe 15x TKE L GTB 15x WS onto 4 in block 15x(5 w/o UE support)  [facility] Adult PT Treatment:                                                DATE: 07/09/23 Therapeutic Exercise: Seated knee flexion 15x2 (rapid) Supine SLR L 15x SAQs 2s hold 15x2 Supine heel slides over roll 15x2 ~70d flexion measured FAQs 2# 15 x2 Standing heel/toe 15x Manual Therapy: Posterior tibial mobilization 5x10  PATIENT EDUCATION:  Education details: Discussed eval findings, rehab rationale and POC and patient is in agreement  Person educated: Patient Education method: Explanation Education comprehension: verbalized understanding and needs further education  HOME EXERCISE PROGRAM: Access Code: [email or web address] URL: [email or web address] Date: 07/30/2023 Prepared by: Edwinna Areola  Exercises - Seated Heel Slide  - 5 x daily - 5 x weekly - 1 sets - 10 reps - Seated Long Arc Quad  - 5 x daily - 5 x weekly - 1 sets - 10 reps - Heel Toe Raises with Counter Support  - 5 x daily - 5 x weekly - 1 sets - 10 reps - Supine Heel Slide with Strap  - 3 x daily - 7 x weekly - 2 sets - 10 reps - 5-10 hold  ASSESSMENT:  CLINICAL IMPRESSION:  Pt was able to complete prescribed exercises with no adverse effect, did have continued pain with L knee flexion and actively could not get back to prior level after 2 weeks off. Passively PT was able to improve L knee flexion to 90 degrees. HEP updated for supine AAROM heel slide. She continues to benefit from skilled PT, will continue to progress as able per POC.   Patient is a 77 y.o. female who was seen today for physical therapy evaluation and treatment for L knee pain following TKA 07/01/23. Patient ambulating with a RW, WBAT using step through gait with minimal L flexion in swing through.  Non-removable dressing in place w/o signs of drainage, redness or bruising observed.   ROM and strength deficits present.  Pain levels controlled.  OBJECTIVE IMPAIRMENTS: Abnormal gait, decreased activity tolerance, decreased coordination, decreased knowledge of condition, decreased knowledge of use of DME, decreased mobility, difficulty walking, decreased ROM, decreased strength, increased fascial restrictions, improper body mechanics, postural dysfunction, obesity, and pain.   ACTIVITY LIMITATIONS:  carrying, lifting, bending, sitting, standing, squatting, sleeping, stairs, transfers, and bed mobility  PERSONAL FACTORS: Age, Fitness, and Past/current experiences are also affecting patient's functional outcome.   REHAB POTENTIAL: Good  CLINICAL DECISION MAKING: Stable/uncomplicated  EVALUATION COMPLEXITY: Low   GOALS: Goals reviewed with patient? Yes  SHORT TERM GOALS: Target date: 08/05/2023   Patient to demonstrate independence in HEP  Baseline: [id number] Goal status: INITIAL  2.  90d PROM knee flexion L Baseline:  Knee flexion  65d  Knee extension  -3d   Goal status: INITIAL  3.  244ft ambulation with LRAD Baseline: 34ft with RW Goal status: INITIAL    LONG TERM GOALS: Target date: 09/02/2023  552ft ambulation with LRAD Baseline: 68ft with RW Goal status: INITIAL  2.  110d PROM knee flexion L Baseline:  Knee flexion  65d  Knee extension  -3d   Goal status: INITIAL  3.  Patient to negotiate 16 steps with most appropriate pattern Baseline: UTA Goal status: INITIAL  4.  4/5 LLE strength throughout Baseline:  MMT Right eval Left eval  Hip flexion  3+  Hip extension  3+  Hip abduction  3+  Hip adduction    Hip internal rotation    Hip external rotation    Knee flexion  3  Knee extension  3-  Ankle dorsiflexion    Ankle plantarflexion  4   Goal status: INITIAL  5.  Patient will score at least 43% on FOTO to signify clinically meaningful improvement in functional abilities.   Baseline: 17% Goal status: INITIAL  6.  Decrease pain to 4/10 at best Baseline: 8/10 at worst Goal status: INITIAL  PLAN:  PT FREQUENCY: 1-2x/week  PT DURATION: 8 weeks  PLANNED INTERVENTIONS: Therapeutic exercises, Therapeutic activity, Neuromuscular re-education, Balance training, Gait training, Patient/Family education, Self Care, Joint mobilization, Stair training, DME instructions, Aquatic Therapy, Dry Needling, Electrical stimulation, Cryotherapy, Moist  heat, Manual therapy, and Re-evaluation  PLAN FOR NEXT SESSION: HEP review and update, manual techniques as appropriate, aerobic tasks, ROM and flexibility activities, strengthening and PREs, TPDN, gait and balance training as needed     Eloy End, PT 07/30/2023, 12:27 PM   Referring diagnosis? L TKA Treatment diagnosis? (if different than referring diagnosis) L TKA What was this (referring dx) caused by? [x]  Surgery []  Fall []  Ongoing issue []  Arthritis []  Other: ____________  Laterality: []  Rt [x]  Lt []  Both  Check all possible CPT codes:  *CHOOSE 10 OR LESS*    []  97110 (Therapeutic Exercise)  []  92507 (SLP Treatment)  []  97112 (Neuro Re-ed)   []  92526 (Swallowing Treatment)   []  97116 (Gait Training)   []  [id number] (Cognitive Training, 1st 15 minutes) []  97140 (Manual Therapy)   []  97130 (Cognitive Training, each add'l 15 minutes)  []  97164 (Re-evaluation)                              []  Other, List CPT Code ____________  []  97530 (Therapeutic Activities)     []  97535 (Self Care)   [x]  All codes above (97110 - 97535)  []  97012 (Mechanical Traction)  []  97014 (E-stim Unattended)  []  97032 (E-stim manual)  []  97033 (Ionto)  []  97035 (Ultrasound) []  97750 (Physical Performance Training) []  [id number] (Aquatic Therapy) []  97016 (Vasopneumatic Device) []  [id number] (Paraffin) []  97034 (Contrast Bath) []  97597 (Wound Care 1st 20 sq cm) []  97598 (Wound Care each add'l 20 sq cm) []  97760 (Orthotic Fabrication, Fitting, Training Initial) []  [id number] (Prosthetic Management and Training Initial) []  [id number] (Orthotic or Prosthetic Training/ Modification Subsequent)

## 2023-07-30 NOTE — Therapy (Signed)
OUTPATIENT PHYSICAL THERAPY LOWER EXTREMITY EVALUATION   Patient Name: Danielle Sexton MRN: [id number] DOB:07/10/1946, 77 y.o., female Today's Date: 08/01/2023  END OF SESSION:  PT End of Session - 08/01/23 1045     Visit Number 5    Number of Visits 16    Date for PT Re-Evaluation 09/03/23    Authorization Type [employer] MCR    PT Start Time 1045    PT Stop Time 1130    PT Time Calculation (min) 45 min    Activity Tolerance Patient tolerated treatment well;Patient limited by pain    Behavior During Therapy [facility] for tasks assessed/performed                Past Medical History:  Diagnosis Date   Achilles tendinitis of left lower extremity 11/25/2021   Allergic rhinitis 11/25/2021   Anxiety    GERD (gastroesophageal reflux disease)    Heart murmur    History of hiatal hernia    HTN (hypertension) 11/25/2021   Hyperlipidemia 11/25/2021   OA (osteoarthritis) of hip 11/25/2021   OA (osteoarthritis) of knee 11/25/2021   Past Surgical History:  Procedure Laterality Date   ABDOMINAL HYSTERECTOMY     BREAST EXCISIONAL BIOPSY Right    benign   COLONOSCOPY     ESOPHAGEAL MANOMETRY N/A 01/16/2022   Procedure: ESOPHAGEAL MANOMETRY (EM);  Surgeon: Kerin Salen, MD;  Location: [facility];  Service: Gastroenterology;  Laterality: N/A;   TOTAL HIP ARTHROPLASTY Right 04/11/2022   Procedure: TOTAL HIP ARTHROPLASTY ANTERIOR APPROACH;  Surgeon: Durene Romans, MD;  Location: [facility];  Service: Orthopedics;  Laterality: Right;   TOTAL KNEE ARTHROPLASTY Left 07/01/2023   Procedure: TOTAL KNEE ARTHROPLASTY;  Surgeon: Durene Romans, MD;  Location: [facility];  Service: Orthopedics;  Laterality: Left;   UPPER GI ENDOSCOPY     Patient Active Problem List   Diagnosis Date Noted   S/P total knee arthroplasty, left 07/01/2023   Adjustment disorder 05/31/2022   Chronic pain 05/31/2022   Diverticular disease of colon 05/31/2022   Dyslipidemia 05/31/2022   Dysphagia, unspecified 05/31/2022    Esophageal diverticulum 05/31/2022   Morbid obesity (HCC) 05/31/2022   History of colon polyps 05/31/2022   Pure hypercholesterolemia 05/31/2022   S/P total right hip arthroplasty 04/11/2022   Pain in joint of right hip 11/30/2021   HTN (hypertension) 11/25/2021   OA (osteoarthritis) of knee 11/25/2021   Hyperlipidemia 11/25/2021   Allergic rhinitis 11/25/2021   Achilles tendinitis of left lower extremity 11/25/2021   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   Presbycusis of both ears 11/10/2020   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    PCP: Vladimir Crofts, FNP   REFERRING PROVIDER: Cassandria Anger, PA-C  REFERRING DIAG: [phone] (ICD-10-CM) - Pain in left knee  THERAPY DIAG:  Acute pain of left knee  Other abnormalities of gait and mobility  Muscle weakness (generalized)  Rationale for Evaluation and Treatment: Rehabilitation  ONSET DATE: 07/01/23  SUBJECTIVE:   SUBJECTIVE STATEMENT: Bruising is resolving.  Pain levels managed, symptoms range from sharp to sore.    PERTINENT HISTORY: Pt is 77 yo female admitted on 07/01/23 for L TKA. Pt with hx including but not limited to chronic pain, obesity, HCL, R THA 6/23, HTN, OA, PAIN:  Are you having pain? Yes: NPRS scale: 8/10 Pain location: L knee Pain description: ache, throb Aggravating factors: ROM, activity Relieving factors: medication, rest  PRECAUTIONS: Knee  RED FLAGS: None   WEIGHT BEARING RESTRICTIONS: No  FALLS:  Has patient  fallen in last 6 months? No  OCCUPATION: not working  PLOF: Independent with basic ADLs  PATIENT GOALS: To regain my knee function  NEXT MD VISIT: 2 weeks  OBJECTIVE:   DIAGNOSTIC FINDINGS: N/A  PATIENT SURVEYS:  FOTO 17(43 predicted)  MUSCLE LENGTH: deferred  POSTURE: N/A   LOWER EXTREMITY ROM:  Passive ROM Right eval Left eval  Hip flexion    Hip extension    Hip abduction    Hip adduction    Hip internal rotation    Hip external rotation    Knee flexion  65d   Knee extension  -3d  Ankle dorsiflexion    Ankle plantarflexion    Ankle inversion    Ankle eversion     (Blank rows = not tested)  LOWER EXTREMITY MMT:  MMT Right eval Left eval  Hip flexion  3+  Hip extension  3+  Hip abduction  3+  Hip adduction    Hip internal rotation    Hip external rotation    Knee flexion  3  Knee extension  3-  Ankle dorsiflexion    Ankle plantarflexion  4  Ankle inversion    Ankle eversion     (Blank rows = not tested)  LOWER EXTREMITY SPECIAL TESTS:  deferred  FUNCTIONAL TESTS:  30 seconds chair stand test 1 rep  GAIT: Distance walked: 67ft x2 Assistive device utilized: Environmental consultant - 2 wheeled Level of assistance: Modified independence Comments: antalgic    TODAY'S TREATMENT:    [facility] Adult PT Treatment:                                                DATE: 07/17/23 Therapeutic Exercise: Seated knee flexion 15x2 (rapid) 2# Supine SLR L 15x SAQs 2s hold 15x2 2# Supine heel slides over roll 15x2 ~82d flexion measured, -2-3d extension FAQs 2# 15 x2 Standing heel/toe 15x TKE L GTB 15x WS onto 4 in block 15x(5 w/o UE support)    [facility] Adult PT Treatment:                                                DATE: 07/09/23 Therapeutic Exercise: Seated knee flexion 15x2 (rapid) Supine SLR L 15x SAQs 2s hold 15x2 Supine heel slides over roll 15x2 ~70d flexion measured FAQs 2# 15 x2 Standing heel/toe 15x Manual Therapy: Posterior tibial mobilization 5x10                                                                                                                            DATE: 07/04/23 Eval and HEP   PATIENT EDUCATION:  Education details: Discussed eval findings, rehab rationale and POC and patient is in agreement  Person educated: Patient Education method: Explanation Education comprehension: verbalized understanding and needs further education  HOME EXERCISE PROGRAM: Access Code: [email or web address] URL:  [email or web address] Date: 07/04/2023 Prepared by: Gustavus Bryant  Exercises - Seated Heel Slide  - 5 x daily - 5 x weekly - 1 sets - 10 reps - Seated Long Arc Quad  - 5 x daily - 5 x weekly - 1 sets - 10 reps - Heel Toe Raises with Counter Support  - 5 x daily - 5 x weekly - 1 sets - 10 reps  ASSESSMENT:  CLINICAL IMPRESSION: Continued to work on regaining quad control and flexion ROM.  Pain remains managed.  Gait pattern shows step through with good knee flexion observed during swing phase.  Added light resistance to tasks as noted.  Began proprioceptive work w/o UE support.  Excellent progress to date.  Patient is a 77 y.o. female who was seen today for physical therapy evaluation and treatment for L knee pain following TKA 07/01/23. Patient ambulating with a RW, WBAT using step through gait with minimal L flexion in swing through.  Non-removable dressing in place w/o signs of drainage, redness or bruising observed.   ROM and strength deficits present.  Pain levels controlled.  OBJECTIVE IMPAIRMENTS: Abnormal gait, decreased activity tolerance, decreased coordination, decreased knowledge of condition, decreased knowledge of use of DME, decreased mobility, difficulty walking, decreased ROM, decreased strength, increased fascial restrictions, improper body mechanics, postural dysfunction, obesity, and pain.   ACTIVITY LIMITATIONS: carrying, lifting, bending, sitting, standing, squatting, sleeping, stairs, transfers, and bed mobility  PERSONAL FACTORS: Age, Fitness, and Past/current experiences are also affecting patient's functional outcome.   REHAB POTENTIAL: Good  CLINICAL DECISION MAKING: Stable/uncomplicated  EVALUATION COMPLEXITY: Low   GOALS: Goals reviewed with patient? Yes  SHORT TERM GOALS: Target date: 08/05/2023   Patient to demonstrate independence in HEP  Baseline: [id number] Goal status: INITIAL  2.  90d PROM knee flexion L Baseline:  Knee flexion   65d  Knee extension  -3d   Goal status: INITIAL  3.  223ft ambulation with LRAD Baseline: 96ft with RW Goal status: INITIAL    LONG TERM GOALS: Target date: 09/02/2023  567ft ambulation with LRAD Baseline: 27ft with RW Goal status: INITIAL  2.  110d PROM knee flexion L Baseline:  Knee flexion  65d  Knee extension  -3d   Goal status: INITIAL  3.  Patient to negotiate 16 steps with most appropriate pattern Baseline: UTA Goal status: INITIAL  4.  4/5 LLE strength throughout Baseline:  MMT Right eval Left eval  Hip flexion  3+  Hip extension  3+  Hip abduction  3+  Hip adduction    Hip internal rotation    Hip external rotation    Knee flexion  3  Knee extension  3-  Ankle dorsiflexion    Ankle plantarflexion  4   Goal status: INITIAL  5.  Patient will score at least 43% on FOTO to signify clinically meaningful improvement in functional abilities.   Baseline: 17% Goal status: INITIAL  6.  Decrease pain to 4/10 at best Baseline: 8/10 at worst Goal status: INITIAL   PLAN:  PT FREQUENCY: 1-2x/week  PT DURATION: 8 weeks  PLANNED INTERVENTIONS: Therapeutic exercises, Therapeutic activity, Neuromuscular re-education, Balance training, Gait training, Patient/Family education, Self Care, Joint mobilization, Stair training, DME instructions, Aquatic Therapy, Dry Needling, Electrical stimulation, Cryotherapy, Moist heat, Manual therapy, and Re-evaluation  PLAN FOR NEXT SESSION: HEP review and update, manual techniques as appropriate, aerobic tasks,  ROM and flexibility activities, strengthening and PREs, TPDN, gait and balance training as needed     Hildred Laser, PT 08/01/2023, 12:04 PM   Referring diagnosis? L TKA Treatment diagnosis? (if different than referring diagnosis) L TKA What was this (referring dx) caused by? [x]  Surgery []  Fall []  Ongoing issue []  Arthritis []  Other: ____________  Laterality: []  Rt [x]  Lt []  Both  Check all possible  CPT codes:  *CHOOSE 10 OR LESS*    []  19147 (Therapeutic Exercise)  []  92507 (SLP Treatment)  []  97112 (Neuro Re-ed)   []  92526 (Swallowing Treatment)   []  97116 (Gait Training)   []  [id number] (Cognitive Training, 1st 15 minutes) []  97140 (Manual Therapy)   []  97130 (Cognitive Training, each add'l 15 minutes)  []  97164 (Re-evaluation)                              []  Other, List CPT Code ____________  []  97530 (Therapeutic Activities)     []  97535 (Self Care)   [x]  All codes above (97110 - 97535)  []  97012 (Mechanical Traction)  []  97014 (E-stim Unattended)  []  97032 (E-stim manual)  []  97033 (Ionto)  []  97035 (Ultrasound) []  97750 (Physical Performance Training) []  [id number] (Aquatic Therapy) []  97016 (Vasopneumatic Device) []  [id number] (Paraffin) []  97034 (Contrast Bath) []  97597 (Wound Care 1st 20 sq cm) []  97598 (Wound Care each add'l 20 sq cm) []  97760 (Orthotic Fabrication, Fitting, Training Initial) []  [id number] (Prosthetic Management and Training Initial) []  [id number] (Orthotic or Prosthetic Training/ Modification Subsequent)

## 2023-08-01 ENCOUNTER — Ambulatory Visit: Payer: Medicare HMO

## 2023-08-01 DIAGNOSIS — M25562 Pain in left knee: Secondary | ICD-10-CM

## 2023-08-01 DIAGNOSIS — M6281 Muscle weakness (generalized): Secondary | ICD-10-CM

## 2023-08-01 DIAGNOSIS — R2689 Other abnormalities of gait and mobility: Secondary | ICD-10-CM

## 2023-08-01 NOTE — Therapy (Signed)
OUTPATIENT PHYSICAL THERAPY TREATMENT   Patient Name: Danielle Sexton MRN: [id number] DOB:Jun 27, 1946, 77 y.o., female Today's Date: 08/01/2023  END OF SESSION:  PT End of Session - 08/01/23 1045     Visit Number 5    Number of Visits 16    Date for PT Re-Evaluation 09/03/23    Authorization Type [employer] M[employer]    PT Start Time 1045    PT Stop Time 1130    PT Time Calculation (min) 45 min    Activity Tolerance Patient tolerated treatment well;Patient limited by pain    Behavior During Therapy [facility] for tasks assessed/performed                Past Medical History:  Diagnosis Date   Achilles tendinitis of left lower extremity 11/25/2021   Allergic rhinitis 11/25/2021   Anxiety    GERD (gastroesophageal reflux disease)    Heart murmur    History of hiatal hernia    HTN (hypertension) 11/25/2021   Hyperlipidemia 11/25/2021   OA (osteoarthritis) of hip 11/25/2021   OA (osteoarthritis) of knee 11/25/2021   Past Surgical History:  Procedure Laterality Date   ABDOMINAL HYSTERECTOMY     BREAST EXCISIONAL BIOPSY Right    benign   COLONOSCOPY     ESOPHAGEAL MANOMETRY N/A 01/16/2022   Procedure: ESOPHAGEAL MANOMETRY (EM);  Surgeon: Kerin Salen, MD;  Location: [facility];  Service: Gastroenterology;  Laterality: N/A;   TOTAL HIP ARTHROPLASTY Right 04/11/2022   Procedure: TOTAL HIP ARTHROPLASTY ANTERIOR APPROACH;  Surgeon: Durene Romans, MD;  Location: [facility];  Service: Orthopedics;  Laterality: Right;   TOTAL KNEE ARTHROPLASTY Left 07/01/2023   Procedure: TOTAL KNEE ARTHROPLASTY;  Surgeon: Durene Romans, MD;  Location: [facility];  Service: Orthopedics;  Laterality: Left;   UPPER GI ENDOSCOPY     Patient Active Problem List   Diagnosis Date Noted   S/P total knee arthroplasty, left 07/01/2023   Adjustment disorder 05/31/2022   Chronic pain 05/31/2022   Diverticular disease of colon 05/31/2022   Dyslipidemia 05/31/2022   Dysphagia, unspecified 05/31/2022   Esophageal  diverticulum 05/31/2022   Morbid obesity (HCC) 05/31/2022   History of colon polyps 05/31/2022   Pure hypercholesterolemia 05/31/2022   S/P total right hip arthroplasty 04/11/2022   Pain in joint of right hip 11/30/2021   HTN (hypertension) 11/25/2021   OA (osteoarthritis) of knee 11/25/2021   Hyperlipidemia 11/25/2021   Allergic rhinitis 11/25/2021   Achilles tendinitis of left lower extremity 11/25/2021   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   Presbycusis of both ears 11/10/2020   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    PCP: Vladimir Crofts, FNP   REFERRING PROVIDER: Cassandria Anger, PA-C  REFERRING DIAG: [phone] (ICD-10-CM) - Pain in left knee  THERAPY DIAG:  Acute pain of left knee  Other abnormalities of gait and mobility  Muscle weakness (generalized)  Rationale for Evaluation and Treatment: Rehabilitation  ONSET DATE: 07/01/23  SUBJECTIVE:   SUBJECTIVE STATEMENT: L knee discomfort 5/10 on average, spikes to 7/10 at worst.  Has advanced to QC for ambulation and fels comfortable with transition   PERTINENT HISTORY: Pt is 77 yo female admitted on 07/01/23 for L TKA. Pt with hx including but not limited to chronic pain, obesity, HCL, R THA 6/23, HTN, OA,  PAIN:  Are you having pain?  Yes: NPRS scale: 8/10 Pain location: L knee Pain description: ache, throb Aggravating factors: ROM, activity Relieving factors: medication, rest  PRECAUTIONS: Knee  RED FLAGS: None  WEIGHT BEARING RESTRICTIONS: No  FALLS:  Has patient fallen in last 6 months? No  OCCUPATION: not working  PLOF: Independent with basic ADLs  PATIENT GOALS: To regain my knee function  NEXT MD VISIT: 2 weeks  OBJECTIVE:   DIAGNOSTIC FINDINGS: N/A  PATIENT SURVEYS:  FOTO 17(43 predicted)  MUSCLE LENGTH: deferred  POSTURE: N/A   LOWER EXTREMITY ROM:  Passive ROM Right eval Left eval Left 07/30/23  Hip flexion     Hip extension     Hip abduction     Hip adduction     Hip  internal rotation     Hip external rotation     Knee flexion  65d 91 passive  Knee extension  -3d 0  Ankle dorsiflexion     Ankle plantarflexion     Ankle inversion     Ankle eversion      (Blank rows = not tested)  LOWER EXTREMITY MMT:  MMT Right eval Left eval  Hip flexion  3+  Hip extension  3+  Hip abduction  3+  Hip adduction    Hip internal rotation    Hip external rotation    Knee flexion  3  Knee extension  3-  Ankle dorsiflexion    Ankle plantarflexion  4  Ankle inversion    Ankle eversion     (Blank rows = not tested)  LOWER EXTREMITY SPECIAL TESTS:  deferred  FUNCTIONAL TESTS:  30 seconds chair stand test 1 rep  GAIT: Distance walked: 20ft x2 Assistive device utilized: Environmental consultant - 2 wheeled Level of assistance: Modified independence Comments: antalgic    TODAY'S TREATMENT:    [facility] Adult PT Treatment:                                                DATE: 08/01/23 Therapeutic Exercise: NuStep lvl 4 UE/LE x 8 min FAQs over towel roll 3# 15x2 Supine heel slide with strap 15x f/b 10x with PT OP SAQs 3# 15x2 Flexion on footstool 15x Seated flexion reviewed and corected for more stretch  [facility] Adult PT Treatment:                                                DATE: 07/30/23 Therapeutic Exercise: NuStep lvl 5 UE/LE x 4 min while taking subjective Seated heel slide 2x10 - 5" hold R Supine heel slide with strap 2x10 - 5" hold PT assist heel slide L  Supine QS L heel on block x 10 - 5" hold Supine SLR 2x10 2# L LAQ L 2x10 2# Standing mini squat x 10 with UE suport  [facility] Adult PT Treatment:                                                DATE: 07/17/23 Therapeutic Exercise: Seated knee flexion 15x2 (rapid) 2# Supine SLR L 15x SAQs 2s hold 15x2 2# Supine heel slides over roll 15x2 ~82d flexion measured, -2-3d extension FAQs 2# 15 x2 Standing heel/toe 15x TKE L GTB 15x WS onto 4 in block 15x(5 w/o UE support)  [facility] Adult PT Treatment:  DATE: 07/09/23 Therapeutic Exercise: Seated knee flexion 15x2 (rapid) Supine SLR L 15x SAQs 2s hold 15x2 Supine heel slides over roll 15x2 ~70d flexion measured FAQs 2# 15 x2 Standing heel/toe 15x Manual Therapy: Posterior tibial mobilization 5x10                                                                                                                            PATIENT EDUCATION:  Education details: Discussed eval findings, rehab rationale and POC and patient is in agreement  Person educated: Patient Education method: Explanation Education comprehension: verbalized understanding and needs further education  HOME EXERCISE PROGRAM: Access Code: [email or web address] URL: [email or web address] Date: 07/30/2023 Prepared by: Edwinna Areola  Exercises - Seated Heel Slide  - 5 x daily - 5 x weekly - 1 sets - 10 reps - Seated Long Arc Quad  - 5 x daily - 5 x weekly - 1 sets - 10 reps - Heel Toe Raises with Counter Support  - 5 x daily - 5 x weekly - 1 sets - 10 reps - Supine Heel Slide with Strap  - 3 x daily - 7 x weekly - 2 sets - 10 reps - 5-10 hold  ASSESSMENT:  CLINICAL IMPRESSION: Increased weight and resistance as noted.  Focused on flexion ROM able to get to 92d only after PT intervention and manual techniques.  Reviewed HEP and added flexion on step.  Instructed to modify seated flexion by planting foot and advancing body weight over knee.  Patient is a 77 y.o. female who was seen today for physical therapy evaluation and treatment for L knee pain following TKA 07/01/23. Patient ambulating with a RW, WBAT using step through gait with minimal L flexion in swing through.  Non-removable dressing in place w/o signs of drainage, redness or bruising observed.   ROM and strength deficits present.  Pain levels controlled.  OBJECTIVE IMPAIRMENTS: Abnormal gait, decreased activity tolerance, decreased coordination, decreased knowledge of condition,  decreased knowledge of use of DME, decreased mobility, difficulty walking, decreased ROM, decreased strength, increased fascial restrictions, improper body mechanics, postural dysfunction, obesity, and pain.   ACTIVITY LIMITATIONS: carrying, lifting, bending, sitting, standing, squatting, sleeping, stairs, transfers, and bed mobility  PERSONAL FACTORS: Age, Fitness, and Past/current experiences are also affecting patient's functional outcome.   REHAB POTENTIAL: Good  CLINICAL DECISION MAKING: Stable/uncomplicated  EVALUATION COMPLEXITY: Low   GOALS: Goals reviewed with patient? Yes  SHORT TERM GOALS: Target date: 08/05/2023   Patient to demonstrate independence in HEP  Baseline: [id number] Goal status: INITIAL  2.  90d PROM knee flexion L Baseline:  Knee flexion  65d  Knee extension  -3d   Goal status: INITIAL  3.  24ft ambulation with LRAD Baseline: 7ft with RW Goal status: INITIAL    LONG TERM GOALS: Target date: 09/02/2023  552ft ambulation with LRAD Baseline: 78ft with RW Goal status: INITIAL  2.  110d PROM knee flexion L Baseline:  Knee flexion  65d  Knee  extension  -3d   Goal status: INITIAL  3.  Patient to negotiate 16 steps with most appropriate pattern Baseline: UTA Goal status: INITIAL  4.  4/5 LLE strength throughout Baseline:  MMT Right eval Left eval  Hip flexion  3+  Hip extension  3+  Hip abduction  3+  Hip adduction    Hip internal rotation    Hip external rotation    Knee flexion  3  Knee extension  3-  Ankle dorsiflexion    Ankle plantarflexion  4   Goal status: INITIAL  5.  Patient will score at least 43% on FOTO to signify clinically meaningful improvement in functional abilities.   Baseline: 17% Goal status: INITIAL  6.  Decrease pain to 4/10 at best Baseline: 8/10 at worst Goal status: INITIAL   PLAN:  PT FREQUENCY: 1-2x/week  PT DURATION: 8 weeks  PLANNED INTERVENTIONS: Therapeutic exercises, Therapeutic  activity, Neuromuscular re-education, Balance training, Gait training, Patient/Family education, Self Care, Joint mobilization, Stair training, DME instructions, Aquatic Therapy, Dry Needling, Electrical stimulation, Cryotherapy, Moist heat, Manual therapy, and Re-evaluation  PLAN FOR NEXT SESSION: HEP review and update, manual techniques as appropriate, aerobic tasks, ROM and flexibility activities, strengthening and PREs, TPDN, gait and balance training as needed     Hildred Laser, PT 08/01/2023, 12:06 PM   Referring diagnosis? L TKA Treatment diagnosis? (if different than referring diagnosis) L TKA What was this (referring dx) caused by? [x]  Surgery []  Fall []  Ongoing issue []  Arthritis []  Other: ____________  Laterality: []  Rt [x]  Lt []  Both  Check all possible CPT codes:  *CHOOSE 10 OR LESS*    []  97110 (Therapeutic Exercise)  []  92507 (SLP Treatment)  []  97112 (Neuro Re-ed)   []  92526 (Swallowing Treatment)   []  97116 (Gait Training)   []  [id number] (Cognitive Training, 1st 15 minutes) []  97140 (Manual Therapy)   []  97130 (Cognitive Training, each add'l 15 minutes)  []  97164 (Re-evaluation)                              []  Other, List CPT Code ____________  []  97530 (Therapeutic Activities)     []  97535 (Self Care)   [x]  All codes above (97110 - 97535)  []  97012 (Mechanical Traction)  []  52841 (E-stim Unattended)  []  97032 (E-stim manual)  []  97033 (Ionto)  []  97035 (Ultrasound) []  97750 (Physical Performance Training) []  [id number] (Aquatic Therapy) []  97016 (Vasopneumatic Device) []  [id number] (Paraffin) []  97034 (Contrast Bath) []  97597 (Wound Care 1st 20 sq cm) []  97598 (Wound Care each add'l 20 sq cm) []  97760 (Orthotic Fabrication, Fitting, Training Initial) []  [id number] (Prosthetic Management and Training Initial) []  [id number] (Orthotic or Prosthetic Training/ Modification Subsequent)

## 2023-08-04 ENCOUNTER — Ambulatory Visit: Payer: Medicare HMO

## 2023-08-04 DIAGNOSIS — M25562 Pain in left knee: Secondary | ICD-10-CM

## 2023-08-04 DIAGNOSIS — R2689 Other abnormalities of gait and mobility: Secondary | ICD-10-CM

## 2023-08-04 DIAGNOSIS — M6281 Muscle weakness (generalized): Secondary | ICD-10-CM

## 2023-08-04 NOTE — Therapy (Signed)
OUTPATIENT PHYSICAL THERAPY TREATMENT   Patient Name: Danielle Sexton MRN: [id number] DOB:January 21, 1946, 77 y.o., female Today's Date: 08/04/2023  END OF SESSION:  PT End of Session - 08/04/23 1000     Visit Number 6    Number of Visits 16    Date for PT Re-Evaluation 09/03/23    Authorization Type [employer] MCR    PT Start Time 1010    PT Stop Time 1055    PT Time Calculation (min) 45 min    Activity Tolerance Patient tolerated treatment well;Patient limited by pain    Behavior During Therapy [facility] for tasks assessed/performed                 Past Medical History:  Diagnosis Date   Achilles tendinitis of left lower extremity 11/25/2021   Allergic rhinitis 11/25/2021   Anxiety    GERD (gastroesophageal reflux disease)    Heart murmur    History of hiatal hernia    HTN (hypertension) 11/25/2021   Hyperlipidemia 11/25/2021   OA (osteoarthritis) of hip 11/25/2021   OA (osteoarthritis) of knee 11/25/2021   Past Surgical History:  Procedure Laterality Date   ABDOMINAL HYSTERECTOMY     BREAST EXCISIONAL BIOPSY Right    benign   COLONOSCOPY     ESOPHAGEAL MANOMETRY N/A 01/16/2022   Procedure: ESOPHAGEAL MANOMETRY (EM);  Surgeon: Kerin Salen, MD;  Location: [facility];  Service: Gastroenterology;  Laterality: N/A;   TOTAL HIP ARTHROPLASTY Right 04/11/2022   Procedure: TOTAL HIP ARTHROPLASTY ANTERIOR APPROACH;  Surgeon: Durene Romans, MD;  Location: [facility];  Service: Orthopedics;  Laterality: Right;   TOTAL KNEE ARTHROPLASTY Left 07/01/2023   Procedure: TOTAL KNEE ARTHROPLASTY;  Surgeon: Durene Romans, MD;  Location: [facility];  Service: Orthopedics;  Laterality: Left;   UPPER GI ENDOSCOPY     Patient Active Problem List   Diagnosis Date Noted   S/P total knee arthroplasty, left 07/01/2023   Adjustment disorder 05/31/2022   Chronic pain 05/31/2022   Diverticular disease of colon 05/31/2022   Dyslipidemia 05/31/2022   Dysphagia, unspecified 05/31/2022   Esophageal  diverticulum 05/31/2022   Morbid obesity (HCC) 05/31/2022   History of colon polyps 05/31/2022   Pure hypercholesterolemia 05/31/2022   S/P total right hip arthroplasty 04/11/2022   Pain in joint of right hip 11/30/2021   HTN (hypertension) 11/25/2021   OA (osteoarthritis) of knee 11/25/2021   Hyperlipidemia 11/25/2021   Allergic rhinitis 11/25/2021   Achilles tendinitis of left lower extremity 11/25/2021   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   Presbycusis of both ears 11/10/2020   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    PCP: Vladimir Crofts, FNP   REFERRING PROVIDER: Cassandria Anger, PA-C  REFERRING DIAG: [phone] (ICD-10-CM) - Pain in left knee  THERAPY DIAG:  Acute pain of left knee  Other abnormalities of gait and mobility  Muscle weakness (generalized)  Rationale for Evaluation and Treatment: Rehabilitation  ONSET DATE: 07/01/23  SUBJECTIVE:   SUBJECTIVE STATEMENT:  Pt presents to PT with no current reports of pain, feels like the knee is feeling a little better. Has been compliant with HEP with no adverse effect.   PERTINENT HISTORY: Pt is 77 yo female admitted on 07/01/23 for L TKA. Pt with hx including but not limited to chronic pain, obesity, HCL, R THA 6/23, HTN, OA,  PAIN:  Are you having pain?  Yes: NPRS scale: 8/10 Pain location: L knee Pain description: ache, throb Aggravating factors: ROM, activity Relieving factors: medication, rest  PRECAUTIONS:  Knee  RED FLAGS: None   WEIGHT BEARING RESTRICTIONS: No  FALLS:  Has patient fallen in last 6 months? No  OCCUPATION: not working  PLOF: Independent with basic ADLs  PATIENT GOALS: To regain my knee function  NEXT MD VISIT: 2 weeks  OBJECTIVE:   DIAGNOSTIC FINDINGS: N/A  PATIENT SURVEYS:  FOTO 17(43 predicted)  MUSCLE LENGTH: deferred  POSTURE: N/A   LOWER EXTREMITY ROM:  Passive ROM Right eval Left eval Left 07/30/23  Hip flexion     Hip extension     Hip abduction     Hip  adduction     Hip internal rotation     Hip external rotation     Knee flexion  65d 91 passive  Knee extension  -3d 0  Ankle dorsiflexion     Ankle plantarflexion     Ankle inversion     Ankle eversion      (Blank rows = not tested)  LOWER EXTREMITY MMT:  MMT Right eval Left eval  Hip flexion  3+  Hip extension  3+  Hip abduction  3+  Hip adduction    Hip internal rotation    Hip external rotation    Knee flexion  3  Knee extension  3-  Ankle dorsiflexion    Ankle plantarflexion  4  Ankle inversion    Ankle eversion     (Blank rows = not tested)  LOWER EXTREMITY SPECIAL TESTS:  deferred  FUNCTIONAL TESTS:  30 seconds chair stand test 1 rep  GAIT: Distance walked: 91ft x2 Assistive device utilized: Environmental consultant - 2 wheeled Level of assistance: Modified independence Comments: antalgic    TODAY'S TREATMENT:    [facility] Adult PT Treatment:                                                DATE: 08/04/23 Therapeutic Exercise: NuStep lvl 5 UE/LE x 5 min while taking subjective  Seated hamstring stretch 2x30" L Seated L heel slide x 10 - 5" hold Supine L heel slide 2x10 - 5" hold PT assist heel slide L into flexion  L LAQ 2x10 3# Standing mini squat x 10 with UE suport Step flexion stretch 2x10 L - 5" hold  [facility] Adult PT Treatment:                                                DATE: 08/01/23 Therapeutic Exercise: NuStep lvl 4 UE/LE x 8 min FAQs over towel roll 3# 15x2 Supine heel slide with strap 15x f/b 10x with PT OP SAQs 3# 15x2 Flexion on footstool 15x Seated flexion reviewed and corected for more stretch  [facility] Adult PT Treatment:                                                DATE: 07/30/23 Therapeutic Exercise: NuStep lvl 5 UE/LE x 4 min while taking subjective Seated heel slide 2x10 - 5" hold R Supine heel slide with strap 2x10 - 5" hold PT assist heel slide L  Supine QS L heel on block x 10 - 5" hold  Supine SLR 2x10 2# L LAQ L 2x10 2# Standing mini  squat x 10 with UE suport  [facility] Adult PT Treatment:                                                DATE: 07/17/23 Therapeutic Exercise: Seated knee flexion 15x2 (rapid) 2# Supine SLR L 15x SAQs 2s hold 15x2 2# Supine heel slides over roll 15x2 ~82d flexion measured, -2-3d extension FAQs 2# 15 x2 Standing heel/toe 15x TKE L GTB 15x WS onto 4 in block 15x(5 w/o UE support)  [facility] Adult PT Treatment:                                                DATE: 07/09/23 Therapeutic Exercise: Seated knee flexion 15x2 (rapid) Supine SLR L 15x SAQs 2s hold 15x2 Supine heel slides over roll 15x2 ~70d flexion measured FAQs 2# 15 x2 Standing heel/toe 15x Manual Therapy: Posterior tibial mobilization 5x10                                                                                                                            PATIENT EDUCATION:  Education details: Discussed eval findings, rehab rationale and POC and patient is in agreement  Person educated: Patient Education method: Explanation Education comprehension: verbalized understanding and needs further education  HOME EXERCISE PROGRAM: Access Code: [email or web address] URL: [email or web address] Date: 07/30/2023 Prepared by: Edwinna Areola  Exercises - Seated Heel Slide  - 5 x daily - 5 x weekly - 1 sets - 10 reps - Seated Long Arc Quad  - 5 x daily - 5 x weekly - 1 sets - 10 reps - Heel Toe Raises with Counter Support  - 5 x daily - 5 x weekly - 1 sets - 10 reps - Supine Heel Slide with Strap  - 3 x daily - 7 x weekly - 2 sets - 10 reps - 5-10 hold  ASSESSMENT:  CLINICAL IMPRESSION:  Pt was able to complete prescribed exercises with no adverse effect. Passively PT was able to improve L knee flexion to 93 degrees. She continues to benefit from skilled PT, will continue to progress as able per POC.   EVAL: Patient is a 77 y.o. female who was seen today for physical therapy evaluation and treatment for L knee pain following TKA  07/01/23. Patient ambulating with a RW, WBAT using step through gait with minimal L flexion in swing through.  Non-removable dressing in place w/o signs of drainage, redness or bruising observed.   ROM and strength deficits present.  Pain levels controlled.  OBJECTIVE IMPAIRMENTS: Abnormal gait, decreased activity tolerance, decreased coordination, decreased knowledge of condition, decreased knowledge of use of DME,  decreased mobility, difficulty walking, decreased ROM, decreased strength, increased fascial restrictions, improper body mechanics, postural dysfunction, obesity, and pain.   ACTIVITY LIMITATIONS: carrying, lifting, bending, sitting, standing, squatting, sleeping, stairs, transfers, and bed mobility  PERSONAL FACTORS: Age, Fitness, and Past/current experiences are also affecting patient's functional outcome.   REHAB POTENTIAL: Good  CLINICAL DECISION MAKING: Stable/uncomplicated  EVALUATION COMPLEXITY: Low   GOALS: Goals reviewed with patient? Yes  SHORT TERM GOALS: Target date: 08/05/2023   Patient to demonstrate independence in HEP  Baseline: [id number] Goal status: INITIAL  2.  90d PROM knee flexion L Baseline:  Knee flexion  65d  Knee extension  -3d   Goal status: INITIAL  3.  274ft ambulation with LRAD Baseline: 52ft with RW Goal status: INITIAL    LONG TERM GOALS: Target date: 09/02/2023  554ft ambulation with LRAD Baseline: 63ft with RW Goal status: INITIAL  2.  110d PROM knee flexion L Baseline:  Knee flexion  65d  Knee extension  -3d   Goal status: INITIAL  3.  Patient to negotiate 16 steps with most appropriate pattern Baseline: UTA Goal status: INITIAL  4.  4/5 LLE strength throughout Baseline:  MMT Right eval Left eval  Hip flexion  3+  Hip extension  3+  Hip abduction  3+  Hip adduction    Hip internal rotation    Hip external rotation    Knee flexion  3  Knee extension  3-  Ankle dorsiflexion    Ankle plantarflexion  4    Goal status: INITIAL  5.  Patient will score at least 43% on FOTO to signify clinically meaningful improvement in functional abilities.   Baseline: 17% Goal status: INITIAL  6.  Decrease pain to 4/10 at best Baseline: 8/10 at worst Goal status: INITIAL   PLAN:  PT FREQUENCY: 1-2x/week  PT DURATION: 8 weeks  PLANNED INTERVENTIONS: Therapeutic exercises, Therapeutic activity, Neuromuscular re-education, Balance training, Gait training, Patient/Family education, Self Care, Joint mobilization, Stair training, DME instructions, Aquatic Therapy, Dry Needling, Electrical stimulation, Cryotherapy, Moist heat, Manual therapy, and Re-evaluation  PLAN FOR NEXT SESSION: HEP review and update, manual techniques as appropriate, aerobic tasks, ROM and flexibility activities, strengthening and PREs, TPDN, gait and balance training as needed     Eloy End, PT 08/04/2023, 11:55 AM   Referring diagnosis? L TKA Treatment diagnosis? (if different than referring diagnosis) L TKA What was this (referring dx) caused by? [x]  Surgery []  Fall []  Ongoing issue []  Arthritis []  Other: ____________  Laterality: []  Rt [x]  Lt []  Both  Check all possible CPT codes:  *CHOOSE 10 OR LESS*    []  97110 (Therapeutic Exercise)  []  92507 (SLP Treatment)  []  97112 (Neuro Re-ed)   []  92526 (Swallowing Treatment)   []  69629 (Gait Training)   []  [id number] (Cognitive Training, 1st 15 minutes) []  97140 (Manual Therapy)   []  97130 (Cognitive Training, each add'l 15 minutes)  []  52841 (Re-evaluation)                              []  Other, List CPT Code ____________  []  97530 (Therapeutic Activities)     []  97535 (Self Care)   [x]  All codes above (97110 - 97535)  []  97012 (Mechanical Traction)  []  97014 (E-stim Unattended)  []  97032 (E-stim manual)  []  97033 (Ionto)  []  97035 (Ultrasound) []  97750 (Physical Performance Training) []  [id number] (Aquatic Therapy) []  97016 (Vasopneumatic Device) []  [id number]  (Paraffin) []   28413 (Contrast Bath) []  [phone] (Wound Care 1st 20 sq cm) []  97598 (Wound Care each add'l 20 sq cm) []  97760 (Orthotic Fabrication, Fitting, Training Initial) []  [id number] (Prosthetic Management and Training Initial) []  [id number] (Orthotic or Prosthetic Training/ Modification Subsequent)

## 2023-08-06 ENCOUNTER — Ambulatory Visit: Payer: Medicare HMO

## 2023-08-06 DIAGNOSIS — M6281 Muscle weakness (generalized): Secondary | ICD-10-CM

## 2023-08-06 DIAGNOSIS — R2689 Other abnormalities of gait and mobility: Secondary | ICD-10-CM

## 2023-08-06 DIAGNOSIS — M25562 Pain in left knee: Secondary | ICD-10-CM

## 2023-08-06 NOTE — Therapy (Signed)
OUTPATIENT PHYSICAL THERAPY TREATMENT   Patient Name: Danielle Sexton MRN: [id number] DOB:[birth year], 77 y.o., female Today's Date: 08/06/2023  END OF SESSION:  PT End of Session - 08/06/23 0914     Visit Number 7    Number of Visits 16    Date for PT Re-Evaluation 09/03/23    Authorization Type [employer] MCR    PT Start Time 0930    PT Stop Time 1010    PT Time Calculation (min) 40 min    Activity Tolerance Patient tolerated treatment well;Patient limited by pain    Behavior During Therapy [facility] for tasks assessed/performed                  Past Medical History:  Diagnosis Date   Achilles tendinitis of left lower extremity 11/25/2021   Allergic rhinitis 11/25/2021   Anxiety    GERD (gastroesophageal reflux disease)    Heart murmur    History of hiatal hernia    HTN (hypertension) 11/25/2021   Hyperlipidemia 11/25/2021   OA (osteoarthritis) of hip 11/25/2021   OA (osteoarthritis) of knee 11/25/2021   Past Surgical History:  Procedure Laterality Date   ABDOMINAL HYSTERECTOMY     BREAST EXCISIONAL BIOPSY Right    benign   COLONOSCOPY     ESOPHAGEAL MANOMETRY N/A 01/16/2022   Procedure: ESOPHAGEAL MANOMETRY (EM);  Surgeon: Kerin Salen, MD;  Location: [facility];  Service: Gastroenterology;  Laterality: N/A;   TOTAL HIP ARTHROPLASTY Right 04/11/2022   Procedure: TOTAL HIP ARTHROPLASTY ANTERIOR APPROACH;  Surgeon: Durene Romans, MD;  Location: [facility];  Service: Orthopedics;  Laterality: Right;   TOTAL KNEE ARTHROPLASTY Left 07/01/2023   Procedure: TOTAL KNEE ARTHROPLASTY;  Surgeon: Durene Romans, MD;  Location: [facility];  Service: Orthopedics;  Laterality: Left;   UPPER GI ENDOSCOPY     Patient Active Problem List   Diagnosis Date Noted   S/P total knee arthroplasty, left 07/01/2023   Adjustment disorder 05/31/2022   Chronic pain 05/31/2022   Diverticular disease of colon 05/31/2022   Dyslipidemia 05/31/2022   Dysphagia, unspecified 05/31/2022   Esophageal  diverticulum 05/31/2022   Morbid obesity (HCC) 05/31/2022   History of colon polyps 05/31/2022   Pure hypercholesterolemia 05/31/2022   S/P total right hip arthroplasty 04/11/2022   Pain in joint of right hip 11/30/2021   HTN (hypertension) 11/25/2021   OA (osteoarthritis) of knee 11/25/2021   Hyperlipidemia 11/25/2021   Allergic rhinitis 11/25/2021   Achilles tendinitis of left lower extremity 11/25/2021   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   Presbycusis of both ears 11/10/2020   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    PCP: Vladimir Crofts, FNP   REFERRING PROVIDER: Cassandria Anger, PA-C  REFERRING DIAG: [phone] (ICD-10-CM) - Pain in left knee  THERAPY DIAG:  Acute pain of left knee  Other abnormalities of gait and mobility  Muscle weakness (generalized)  Rationale for Evaluation and Treatment: Rehabilitation  ONSET DATE: 07/01/23  SUBJECTIVE:   SUBJECTIVE STATEMENT:  Pt presents to PT with no current reports of pain, feels like the knee is feeling a little better. Has been compliant with HEP with no adverse effect.   PERTINENT HISTORY: Pt is 77 yo female admitted on 07/01/23 for L TKA. Pt with hx including but not limited to chronic pain, obesity, HCL, R THA 6/23, HTN, OA,  PAIN:  Are you having pain?  Yes: NPRS scale: 8/10 Pain location: L knee Pain description: ache, throb Aggravating factors: ROM, activity Relieving factors: medication, rest  PRECAUTIONS: Knee  RED FLAGS: None   WEIGHT BEARING RESTRICTIONS: No  FALLS:  Has patient fallen in last 6 months? No  OCCUPATION: not working  PLOF: Independent with basic ADLs  PATIENT GOALS: To regain my knee function  NEXT MD VISIT: 2 weeks  OBJECTIVE:   DIAGNOSTIC FINDINGS: N/A  PATIENT SURVEYS:  FOTO 17(43 predicted)  MUSCLE LENGTH: deferred  POSTURE: N/A   LOWER EXTREMITY ROM:  Passive ROM Right eval Left eval Left 07/30/23 Left 08/06/23  Hip flexion      Hip extension      Hip  abduction      Hip adduction      Hip internal rotation      Hip external rotation      Knee flexion  65d 91 passive 95 passive  Knee extension  -3d 0   Ankle dorsiflexion      Ankle plantarflexion      Ankle inversion      Ankle eversion       (Blank rows = not tested)  LOWER EXTREMITY MMT:  MMT Right eval Left eval  Hip flexion  3+  Hip extension  3+  Hip abduction  3+  Hip adduction    Hip internal rotation    Hip external rotation    Knee flexion  3  Knee extension  3-  Ankle dorsiflexion    Ankle plantarflexion  4  Ankle inversion    Ankle eversion     (Blank rows = not tested)  LOWER EXTREMITY SPECIAL TESTS:  deferred  FUNCTIONAL TESTS:  30 seconds chair stand test 1 rep  GAIT: Distance walked: 74ft x2 Assistive device utilized: Environmental consultant - 2 wheeled Level of assistance: Modified independence Comments: antalgic    TODAY'S TREATMENT:    [facility] Adult PT Treatment:                                                DATE: 08/06/23 Therapeutic Exercise: NuStep lvl 5 UE/LE x 5 min while taking subjective  Standing mini squat 2x10 - bilateral UE support Slant board calf stretch 2x30"  Step flexion stretch 2x10 L - 5" hold Step up 8in 2x10 L stance Tandem stance L back 2x30" LAQ 2x10 5# Supine L heel slide 2x10 - 5" hold PT assist heel slide L into flexion   [facility] Adult PT Treatment:                                                DATE: 08/04/23 Therapeutic Exercise: NuStep lvl 5 UE/LE x 5 min while taking subjective  Seated hamstring stretch 2x30" L Seated L heel slide x 10 - 5" hold Supine L heel slide 2x10 - 5" hold PT assist heel slide L into flexion  L LAQ 2x10 3# Standing mini squat x 10 with UE suport Step flexion stretch 2x10 L - 5" hold  [facility] Adult PT Treatment:                                                DATE: 08/01/23 Therapeutic Exercise: NuStep lvl 4 UE/LE  x 8 min FAQs over towel roll 3# 15x2 Supine heel slide with strap 15x f/b 10x with  PT OP SAQs 3# 15x2 Flexion on footstool 15x Seated flexion reviewed and corected for more stretch                                                                                                                         PATIENT EDUCATION:  Education details: Discussed eval findings, rehab rationale and POC and patient is in agreement  Person educated: Patient Education method: Explanation Education comprehension: verbalized understanding and needs further education  HOME EXERCISE PROGRAM: Access Code: [email or web address] URL: [email or web address] Date: 07/30/2023 Prepared by: Edwinna Areola  Exercises - Seated Heel Slide  - 5 x daily - 5 x weekly - 1 sets - 10 reps - Seated Long Arc Quad  - 5 x daily - 5 x weekly - 1 sets - 10 reps - Heel Toe Raises with Counter Support  - 5 x daily - 5 x weekly - 1 sets - 10 reps - Supine Heel Slide with Strap  - 3 x daily - 7 x weekly - 2 sets - 10 reps - 5-10 hold  ASSESSMENT:  CLINICAL IMPRESSION:  Pt was able to complete prescribed exercises with no adverse effect. Passively PT was able to improve L knee flexion to 95 degrees today. She continues to benefit from skilled PT working on improving flexion post surgery, will continue to progress as able per POC.   EVAL: Patient is a 76 y.o. female who was seen today for physical therapy evaluation and treatment for L knee pain following TKA 07/01/23. Patient ambulating with a RW, WBAT using step through gait with minimal L flexion in swing through.  Non-removable dressing in place w/o signs of drainage, redness or bruising observed.   ROM and strength deficits present.  Pain levels controlled.  OBJECTIVE IMPAIRMENTS: Abnormal gait, decreased activity tolerance, decreased coordination, decreased knowledge of condition, decreased knowledge of use of DME, decreased mobility, difficulty walking, decreased ROM, decreased strength, increased fascial restrictions, improper body mechanics, postural dysfunction,  obesity, and pain.   ACTIVITY LIMITATIONS: carrying, lifting, bending, sitting, standing, squatting, sleeping, stairs, transfers, and bed mobility  PERSONAL FACTORS: Age, Fitness, and Past/current experiences are also affecting patient's functional outcome.   REHAB POTENTIAL: Good  CLINICAL DECISION MAKING: Stable/uncomplicated  EVALUATION COMPLEXITY: Low   GOALS: Goals reviewed with patient? Yes  SHORT TERM GOALS: Target date: 08/05/2023   Patient to demonstrate independence in HEP  Baseline: [id number] Goal status: INITIAL  2.  90d PROM knee flexion L Baseline:  Knee flexion  65d  Knee extension  -3d   Goal status: INITIAL  3.  260ft ambulation with LRAD Baseline: 10ft with RW Goal status: INITIAL    LONG TERM GOALS: Target date: 09/02/2023  55ft ambulation with LRAD Baseline: 2ft with RW Goal status: INITIAL  2.  110d PROM knee flexion L Baseline:  Knee flexion  65d  Knee extension  -3d  Goal status: INITIAL  3.  Patient to negotiate 16 steps with most appropriate pattern Baseline: UTA Goal status: INITIAL  4.  4/5 LLE strength throughout Baseline:  MMT Right eval Left eval  Hip flexion  3+  Hip extension  3+  Hip abduction  3+  Hip adduction    Hip internal rotation    Hip external rotation    Knee flexion  3  Knee extension  3-  Ankle dorsiflexion    Ankle plantarflexion  4   Goal status: INITIAL  5.  Patient will score at least 43% on FOTO to signify clinically meaningful improvement in functional abilities.   Baseline: 17% Goal status: INITIAL  6.  Decrease pain to 4/10 at best Baseline: 8/10 at worst Goal status: INITIAL   PLAN:  PT FREQUENCY: 1-2x/week  PT DURATION: 8 weeks  PLANNED INTERVENTIONS: Therapeutic exercises, Therapeutic activity, Neuromuscular re-education, Balance training, Gait training, Patient/Family education, Self Care, Joint mobilization, Stair training, DME instructions, Aquatic Therapy, Dry Needling,  Electrical stimulation, Cryotherapy, Moist heat, Manual therapy, and Re-evaluation  PLAN FOR NEXT SESSION: HEP review and update, manual techniques as appropriate, aerobic tasks, ROM and flexibility activities, strengthening and PREs, TPDN, gait and balance training as needed     Eloy End, PT 08/06/2023, 10:32 AM   Referring diagnosis? L TKA Treatment diagnosis? (if different than referring diagnosis) L TKA What was this (referring dx) caused by? [x]  Surgery []  Fall []  Ongoing issue []  Arthritis []  Other: ____________  Laterality: []  Rt [x]  Lt []  Both  Check all possible CPT codes:  *CHOOSE 10 OR LESS*    []  97110 (Therapeutic Exercise)  []  92507 (SLP Treatment)  []  97112 (Neuro Re-ed)   []  92526 (Swallowing Treatment)   []  97116 (Gait Training)   []  [id number] (Cognitive Training, 1st 15 minutes) []  97140 (Manual Therapy)   []  97130 (Cognitive Training, each add'l 15 minutes)  []  97164 (Re-evaluation)                              []  Other, List CPT Code ____________  []  97530 (Therapeutic Activities)     []  36644 (Self Care)   [x]  All codes above (97110 - 97535)  []  97012 (Mechanical Traction)  []  97014 (E-stim Unattended)  []  97032 (E-stim manual)  []  97033 (Ionto)  []  97035 (Ultrasound) []  97750 (Physical Performance Training) []  [id number] (Aquatic Therapy) []  97016 (Vasopneumatic Device) []  [id number] (Paraffin) []  97034 (Contrast Bath) []  97597 (Wound Care 1st 20 sq cm) []  97598 (Wound Care each add'l 20 sq cm) []  97760 (Orthotic Fabrication, Fitting, Training Initial) []  [id number] (Prosthetic Management and Training Initial) []  [id number] (Orthotic or Prosthetic Training/ Modification Subsequent)

## 2023-08-15 ENCOUNTER — Ambulatory Visit: Payer: Medicare HMO | Attending: Student

## 2023-08-15 DIAGNOSIS — M25562 Pain in left knee: Secondary | ICD-10-CM | POA: Insufficient documentation

## 2023-08-15 DIAGNOSIS — R2689 Other abnormalities of gait and mobility: Secondary | ICD-10-CM | POA: Diagnosis present

## 2023-08-15 DIAGNOSIS — M6281 Muscle weakness (generalized): Secondary | ICD-10-CM | POA: Insufficient documentation

## 2023-08-18 ENCOUNTER — Ambulatory Visit: Payer: Medicare HMO

## 2023-08-18 DIAGNOSIS — R2689 Other abnormalities of gait and mobility: Secondary | ICD-10-CM

## 2023-08-18 DIAGNOSIS — M25562 Pain in left knee: Secondary | ICD-10-CM | POA: Diagnosis not present

## 2023-08-18 DIAGNOSIS — M6281 Muscle weakness (generalized): Secondary | ICD-10-CM

## 2023-08-18 NOTE — Therapy (Signed)
OUTPATIENT PHYSICAL THERAPY TREATMENT   Patient Name: Danielle Sexton MRN: [id number] DOB:25-Jan-1946, 77 y.o., female Today's Date: 08/18/2023  END OF SESSION:  PT End of Session - 08/18/23 1158     Visit Number 9    Number of Visits 16    Date for PT Re-Evaluation 09/03/23    Authorization Type [employer] MCR    PT Start Time 1155    PT Stop Time 1235    PT Time Calculation (min) 40 min    Activity Tolerance Patient tolerated treatment well;Patient limited by pain    Behavior During Therapy [facility] for tasks assessed/performed                   Past Medical History:  Diagnosis Date   Achilles tendinitis of left lower extremity 11/25/2021   Allergic rhinitis 11/25/2021   Anxiety    GERD (gastroesophageal reflux disease)    Heart murmur    History of hiatal hernia    HTN (hypertension) 11/25/2021   Hyperlipidemia 11/25/2021   OA (osteoarthritis) of hip 11/25/2021   OA (osteoarthritis) of knee 11/25/2021   Past Surgical History:  Procedure Laterality Date   ABDOMINAL HYSTERECTOMY     BREAST EXCISIONAL BIOPSY Right    benign   COLONOSCOPY     ESOPHAGEAL MANOMETRY N/A 01/16/2022   Procedure: ESOPHAGEAL MANOMETRY (EM);  Surgeon: Kerin Salen, MD;  Location: [facility];  Service: Gastroenterology;  Laterality: N/A;   TOTAL HIP ARTHROPLASTY Right 04/11/2022   Procedure: TOTAL HIP ARTHROPLASTY ANTERIOR APPROACH;  Surgeon: Durene Romans, MD;  Location: [facility];  Service: Orthopedics;  Laterality: Right;   TOTAL KNEE ARTHROPLASTY Left 07/01/2023   Procedure: TOTAL KNEE ARTHROPLASTY;  Surgeon: Durene Romans, MD;  Location: [facility];  Service: Orthopedics;  Laterality: Left;   UPPER GI ENDOSCOPY     Patient Active Problem List   Diagnosis Date Noted   S/P total knee arthroplasty, left 07/01/2023   Adjustment disorder 05/31/2022   Chronic pain 05/31/2022   Diverticular disease of colon 05/31/2022   Dyslipidemia 05/31/2022   Dysphagia, unspecified 05/31/2022   Esophageal  diverticulum 05/31/2022   Morbid obesity (HCC) 05/31/2022   History of colon polyps 05/31/2022   Pure hypercholesterolemia 05/31/2022   S/P total right hip arthroplasty 04/11/2022   Pain in joint of right hip 11/30/2021   HTN (hypertension) 11/25/2021   OA (osteoarthritis) of knee 11/25/2021   Hyperlipidemia 11/25/2021   Allergic rhinitis 11/25/2021   Achilles tendinitis of left lower extremity 11/25/2021   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   Presbycusis of both ears 11/10/2020   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    PCP: Vladimir Crofts, FNP   REFERRING PROVIDER: Cassandria Anger, PA-C  REFERRING DIAG: [phone] (ICD-10-CM) - Pain in left knee  THERAPY DIAG:  Acute pain of left knee  Other abnormalities of gait and mobility  Muscle weakness (generalized)  Rationale for Evaluation and Treatment: Rehabilitation  ONSET DATE: 07/01/23  SUBJECTIVE:   SUBJECTIVE STATEMENT: Has developed a dermatitis from aquagel dressing.  Contacted MD office and has been prescribed hydrocortisone cream.  PERTINENT HISTORY: Pt is 77 yo female admitted on 07/01/23 for L TKA. Pt with hx including but not limited to chronic pain, obesity, HCL, R THA 6/23, HTN, OA,  PAIN:  Are you having pain?  Yes: NPRS scale: 8/10 Pain location: L knee Pain description: ache, throb Aggravating factors: ROM, activity Relieving factors: medication, rest  PRECAUTIONS: Knee  RED FLAGS: None   WEIGHT BEARING RESTRICTIONS: No  FALLS:  Has patient fallen in last 6 months? No  OCCUPATION: not working  PLOF: Independent with basic ADLs  PATIENT GOALS: To regain my knee function  NEXT MD VISIT: 2 weeks  OBJECTIVE:   DIAGNOSTIC FINDINGS: N/A  PATIENT SURVEYS:  FOTO 17(43 predicted)  MUSCLE LENGTH: deferred  POSTURE: N/A   LOWER EXTREMITY ROM:  Passive ROM Right eval Left eval Left 07/30/23 Left 08/06/23  Hip flexion      Hip extension      Hip abduction      Hip adduction      Hip  internal rotation      Hip external rotation      Knee flexion  65d 91 passive 95 passive  Knee extension  -3d 0   Ankle dorsiflexion      Ankle plantarflexion      Ankle inversion      Ankle eversion       (Blank rows = not tested)  LOWER EXTREMITY MMT:  MMT Right eval Left eval  Hip flexion  3+  Hip extension  3+  Hip abduction  3+  Hip adduction    Hip internal rotation    Hip external rotation    Knee flexion  3  Knee extension  3-  Ankle dorsiflexion    Ankle plantarflexion  4  Ankle inversion    Ankle eversion     (Blank rows = not tested)  LOWER EXTREMITY SPECIAL TESTS:  deferred  FUNCTIONAL TESTS:  30 seconds chair stand test 1 rep  GAIT: Distance walked: 1ft x2 Assistive device utilized: Environmental consultant - 2 wheeled Level of assistance: Modified independence Comments: antalgic    TODAY'S TREATMENT:    [facility] Adult PT Treatment:                                                DATE: 08/18/23 Therapeutic Exercise: Nustep L4 8 min FAQs over towel roll (for leverage) 5# 15x2 SAQs 5# 15x2 Heel slides with strap 15x2 98d flexion Standing heel/toe 15x Standing hamstring curls 15x Flexion over 10 in block 15x ~102d flexion measured  [facility] Adult PT Treatment:                                                DATE: 08/15/23 Therapeutic Exercise: Nustep L4 8 min SLR 15x to determine extensor lag Heel slides with strap 15x f/b 10x with PT OP 10x10 Flexion over 10 in block 15x ~100d flexion measured Therapeutic Activity: Gait with cane 233ft as well as negotiation of 16 stairs with cane and rail.  [facility] Adult PT Treatment:                                                DATE: 08/06/23 Therapeutic Exercise: NuStep lvl 5 UE/LE x 5 min while taking subjective  Standing mini squat 2x10 - bilateral UE support Slant board calf stretch 2x30"  Step flexion stretch 2x10 L - 5" hold Step up 8in 2x10 L stance Tandem stance L back 2x30" LAQ 2x10 5# Supine L heel slide 2x10 - 5"  hold  PT assist heel slide L into flexion   [facility] Adult PT Treatment:                                                DATE: 08/04/23 Therapeutic Exercise: NuStep lvl 5 UE/LE x 5 min while taking subjective  Seated hamstring stretch 2x30" L Seated L heel slide x 10 - 5" hold Supine L heel slide 2x10 - 5" hold PT assist heel slide L into flexion  L LAQ 2x10 3# Standing mini squat x 10 with UE suport Step flexion stretch 2x10 L - 5" hold  [facility] Adult PT Treatment:                                                DATE: 08/01/23 Therapeutic Exercise: NuStep lvl 4 UE/LE x 8 min FAQs over towel roll 3# 15x2 Supine heel slide with strap 15x f/b 10x with PT OP SAQs 3# 15x2 Flexion on footstool 15x Seated flexion reviewed and corected for more stretch                                                                                                                         PATIENT EDUCATION:  Education details: Discussed eval findings, rehab rationale and POC and patient is in agreement  Person educated: Patient Education method: Explanation Education comprehension: verbalized understanding and needs further education  HOME EXERCISE PROGRAM: Access Code: [email or web address] URL: [email or web address] Date: 07/30/2023 Prepared by: Edwinna Areola  Exercises - Seated Heel Slide  - 5 x daily - 5 x weekly - 1 sets - 10 reps - Seated Long Arc Quad  - 5 x daily - 5 x weekly - 1 sets - 10 reps - Heel Toe Raises with Counter Support  - 5 x daily - 5 x weekly - 1 sets - 10 reps - Supine Heel Slide with Strap  - 3 x daily - 7 x weekly - 2 sets - 10 reps - 5-10 hold  ASSESSMENT:  CLINICAL IMPRESSION: Added weight and resistance to focus on quad and extensor strengthening.  ROM remaining at ~100d flexion.  Does not report any major limitation to knee function.  Will f/u with MD on 08/20/23 then return to PT 11/8 to discuss further need of OPPT   EVAL: Patient is a 77 y.o. female who was seen today for  physical therapy evaluation and treatment for L knee pain following TKA 07/01/23. Patient ambulating with a RW, WBAT using step through gait with minimal L flexion in swing through.  Non-removable dressing in place w/o signs of drainage, redness or bruising observed.   ROM and strength deficits present.  Pain levels controlled.  OBJECTIVE IMPAIRMENTS: Abnormal gait, decreased activity tolerance,  decreased coordination, decreased knowledge of condition, decreased knowledge of use of DME, decreased mobility, difficulty walking, decreased ROM, decreased strength, increased fascial restrictions, improper body mechanics, postural dysfunction, obesity, and pain.   ACTIVITY LIMITATIONS: carrying, lifting, bending, sitting, standing, squatting, sleeping, stairs, transfers, and bed mobility  PERSONAL FACTORS: Age, Fitness, and Past/current experiences are also affecting patient's functional outcome.   REHAB POTENTIAL: Good  CLINICAL DECISION MAKING: Stable/uncomplicated  EVALUATION COMPLEXITY: Low   GOALS: Goals reviewed with patient? Yes  SHORT TERM GOALS: Target date: 08/05/2023   Patient to demonstrate independence in HEP  Baseline: [id number] Goal status: Met  2.  90d PROM knee flexion L Baseline:  Knee flexion  65d  Knee extension  -3d  08/15/23; 94d PROM Goal status: Met  3.  213ft ambulation with LRAD Baseline: 97ft with RW; 08/15/23 283ft with SPC Goal status: Met    LONG TERM GOALS: Target date: 09/02/2023  519ft ambulation with LRAD Baseline: 54ft with RW Goal status: INITIAL  2.  110d PROM knee flexion L Baseline:  Knee flexion  65d  Knee extension  -3d   Goal status: INITIAL  3.  Patient to negotiate 16 steps with most appropriate pattern Baseline: UTA Goal status: INITIAL  4.  4/5 LLE strength throughout Baseline:  MMT Right eval Left eval  Hip flexion  3+  Hip extension  3+  Hip abduction  3+  Hip adduction    Hip internal rotation    Hip external  rotation    Knee flexion  3  Knee extension  3-  Ankle dorsiflexion    Ankle plantarflexion  4   Goal status: INITIAL  5.  Patient will score at least 43% on FOTO to signify clinically meaningful improvement in functional abilities.   Baseline: 17% Goal status: INITIAL  6.  Decrease pain to 4/10 at best Baseline: 8/10 at worst Goal status: INITIAL   PLAN:  PT FREQUENCY: 1-2x/week  PT DURATION: 8 weeks  PLANNED INTERVENTIONS: Therapeutic exercises, Therapeutic activity, Neuromuscular re-education, Balance training, Gait training, Patient/Family education, Self Care, Joint mobilization, Stair training, DME instructions, Aquatic Therapy, Dry Needling, Electrical stimulation, Cryotherapy, Moist heat, Manual therapy, and Re-evaluation  PLAN FOR NEXT SESSION: HEP review and update, manual techniques as appropriate, aerobic tasks, ROM and flexibility activities, strengthening and PREs, TPDN, gait and balance training as needed     Hildred Laser, PT 08/18/2023, 12:46 PM   Referring diagnosis? L TKA Treatment diagnosis? (if different than referring diagnosis) L TKA What was this (referring dx) caused by? [x]  Surgery []  Fall []  Ongoing issue []  Arthritis []  Other: ____________  Laterality: []  Rt [x]  Lt []  Both  Check all possible CPT codes:  *CHOOSE 10 OR LESS*    []  97110 (Therapeutic Exercise)  []  92507 (SLP Treatment)  []  97112 (Neuro Re-ed)   []  92526 (Swallowing Treatment)   []  62952 (Gait Training)   []  [id number] (Cognitive Training, 1st 15 minutes) []  97140 (Manual Therapy)   []  97130 (Cognitive Training, each add'l 15 minutes)  []  97164 (Re-evaluation)                              []  Other, List CPT Code ____________  []  97530 (Therapeutic Activities)     []  97535 (Self Care)   [x]  All codes above (97110 - 97535)  []  97012 (Mechanical Traction)  []  84132 (E-stim Unattended)  []  97032 (E-stim manual)  []  97033 (Ionto)  []  97035 (  Ultrasound) []  97750 (Physical  Performance Training) []  [id number] (Aquatic Therapy) []  97016 (Vasopneumatic Device) []  [id number] (Paraffin) []  97034 (Contrast Bath) []  97597 (Wound Care 1st 20 sq cm) []  97598 (Wound Care each add'l 20 sq cm) []  97760 (Orthotic Fabrication, Fitting, Training Initial) []  [id number] (Prosthetic Management and Training Initial) []  [id number] (Orthotic or Prosthetic Training/ Modification Subsequent)

## 2023-08-22 ENCOUNTER — Ambulatory Visit: Payer: Medicare HMO

## 2023-09-16 ENCOUNTER — Ambulatory Visit: Payer: Medicare HMO | Admitting: Podiatry

## 2023-09-16 ENCOUNTER — Encounter: Payer: Self-pay | Admitting: Podiatry

## 2023-09-16 DIAGNOSIS — M79672 Pain in left foot: Secondary | ICD-10-CM

## 2023-09-16 DIAGNOSIS — M79674 Pain in right toe(s): Secondary | ICD-10-CM | POA: Diagnosis not present

## 2023-09-16 DIAGNOSIS — M79675 Pain in left toe(s): Secondary | ICD-10-CM

## 2023-09-16 DIAGNOSIS — M79671 Pain in right foot: Secondary | ICD-10-CM

## 2023-09-16 DIAGNOSIS — B351 Tinea unguium: Secondary | ICD-10-CM

## 2023-09-16 DIAGNOSIS — L84 Corns and callosities: Secondary | ICD-10-CM

## 2023-09-16 NOTE — Progress Notes (Unsigned)
  Subjective:  Patient ID: Danielle Sexton, female    DOB: 12/10/1945,  MRN: [id number]  77 y.o. female presents painful thick toenails that are difficult to trim. Pain interferes with ambulation. Aggravating factors include wearing enclosed shoe gear. Pain is relieved with periodic professional debridement. Chief Complaint  Patient presents with   Foot Pain    Not diabetic, RFC, Dr, Melina Fiddler PCP,    New problem(s): None   PCP is Daye, Margorie John, FNP.  Allergies  Allergen Reactions   Latex Swelling   Review of Systems: Negative except as noted in the HPI.   Objective:  Danielle Sexton is a pleasant 77 y.o. female WD, WN in NAD. AAO x 3.  Vascular Examination: Vascular status intact b/l with palpable pedal pulses. CFT immediate b/l. Pedal hair present. No edema. No pain with calf compression b/l. Skin temperature gradient WNL b/l. No varicosities noted. No cyanosis or clubbing noted.  Neurological Examination: Sensation grossly intact b/l with 10 gram monofilament. Vibratory sensation intact b/l.  Dermatological Examination: Pedal skin with normal turgor, texture and tone b/l. No open wounds nor interdigital macerations noted. Toenails 1-5 b/l thick, discolored, elongated with subungual debris and pain on dorsal palpation. No hyperkeratotic lesions noted b/l.   Musculoskeletal Examination: Normal muscle strength 5/5 to all lower extremity muscle groups bilaterally. HAV with bunion deformity noted b/l LE. Hammertoe deformity noted 2-5 b/l. No pain, crepitus or joint limitation noted with ROM b/l LE. Patient ambulates independently without assistive aids.  Radiographs: None  Last A1c:       No data to display           Assessment:  No diagnosis found.  Plan:  Patient was evaluated and treated. All patient's and/or POA's questions/concerns addressed on today's visit. Toenails 1-5 debrided in length and girth without incident. Continue soft, supportive shoe gear daily.  Report any pedal injuries to medical professional. Call office if there are any questions/concerns. -Patient/POA to call should there be question/concern in the interim.  Return in about 3 months (around 12/15/2023).  Freddie Breech, DPM      [place] LOCATION: [address], [place] [address]                   Office [phone]   [facility] LOCATION: [address] [place], [place] [address] Office [phone]

## 2023-11-06 DIAGNOSIS — I10 Essential (primary) hypertension: Secondary | ICD-10-CM | POA: Diagnosis not present

## 2023-11-06 DIAGNOSIS — F411 Generalized anxiety disorder: Secondary | ICD-10-CM | POA: Diagnosis not present

## 2023-11-06 DIAGNOSIS — Z008 Encounter for other general examination: Secondary | ICD-10-CM | POA: Diagnosis not present

## 2023-11-06 DIAGNOSIS — E669 Obesity, unspecified: Secondary | ICD-10-CM | POA: Diagnosis not present

## 2023-11-06 DIAGNOSIS — Z6832 Body mass index (BMI) 32.0-32.9, adult: Secondary | ICD-10-CM | POA: Diagnosis not present

## 2023-11-06 DIAGNOSIS — M1990 Unspecified osteoarthritis, unspecified site: Secondary | ICD-10-CM | POA: Diagnosis not present

## 2023-12-09 ENCOUNTER — Other Ambulatory Visit: Payer: Self-pay | Admitting: Family

## 2023-12-09 DIAGNOSIS — Z0000 Encounter for general adult medical examination without abnormal findings: Secondary | ICD-10-CM

## 2023-12-15 DIAGNOSIS — H04123 Dry eye syndrome of bilateral lacrimal glands: Secondary | ICD-10-CM | POA: Diagnosis not present

## 2023-12-15 DIAGNOSIS — R0981 Nasal congestion: Secondary | ICD-10-CM | POA: Diagnosis not present

## 2023-12-18 ENCOUNTER — Ambulatory Visit
Admission: RE | Admit: 2023-12-18 | Discharge: 2023-12-18 | Disposition: A | Discharge status: Home / Self Care | Payer: Medicare HMO | Source: Ambulatory Visit | Attending: Family | Admitting: Family

## 2023-12-18 DIAGNOSIS — Z1231 Encounter for screening mammogram for malignant neoplasm of breast: Secondary | ICD-10-CM | POA: Diagnosis not present

## 2023-12-18 DIAGNOSIS — Z0000 Encounter for general adult medical examination without abnormal findings: Secondary | ICD-10-CM

## 2024-01-05 DIAGNOSIS — R3915 Urgency of urination: Secondary | ICD-10-CM | POA: Diagnosis not present

## 2024-01-05 DIAGNOSIS — F411 Generalized anxiety disorder: Secondary | ICD-10-CM | POA: Diagnosis not present

## 2024-01-05 DIAGNOSIS — E669 Obesity, unspecified: Secondary | ICD-10-CM | POA: Diagnosis not present

## 2024-01-05 DIAGNOSIS — J302 Other seasonal allergic rhinitis: Secondary | ICD-10-CM | POA: Diagnosis not present

## 2024-01-05 DIAGNOSIS — Z1331 Encounter for screening for depression: Secondary | ICD-10-CM | POA: Diagnosis not present

## 2024-01-05 DIAGNOSIS — I10 Essential (primary) hypertension: Secondary | ICD-10-CM | POA: Diagnosis not present

## 2024-01-05 DIAGNOSIS — T148XXA Other injury of unspecified body region, initial encounter: Secondary | ICD-10-CM | POA: Diagnosis not present

## 2024-01-05 DIAGNOSIS — K219 Gastro-esophageal reflux disease without esophagitis: Secondary | ICD-10-CM | POA: Diagnosis not present

## 2024-01-05 DIAGNOSIS — E785 Hyperlipidemia, unspecified: Secondary | ICD-10-CM | POA: Diagnosis not present

## 2024-01-05 DIAGNOSIS — I700 Atherosclerosis of aorta: Secondary | ICD-10-CM | POA: Diagnosis not present

## 2024-01-05 DIAGNOSIS — Z6834 Body mass index (BMI) 34.0-34.9, adult: Secondary | ICD-10-CM | POA: Diagnosis not present

## 2024-01-14 ENCOUNTER — Ambulatory Visit: Payer: Medicare HMO | Admitting: Podiatry

## 2024-01-28 DIAGNOSIS — F411 Generalized anxiety disorder: Secondary | ICD-10-CM | POA: Diagnosis not present

## 2024-01-28 DIAGNOSIS — N3281 Overactive bladder: Secondary | ICD-10-CM | POA: Diagnosis not present

## 2024-01-28 DIAGNOSIS — Z0000 Encounter for general adult medical examination without abnormal findings: Secondary | ICD-10-CM | POA: Diagnosis not present

## 2024-02-17 DIAGNOSIS — R238 Other skin changes: Secondary | ICD-10-CM | POA: Diagnosis not present

## 2024-02-17 DIAGNOSIS — F411 Generalized anxiety disorder: Secondary | ICD-10-CM | POA: Diagnosis not present

## 2024-02-17 DIAGNOSIS — Z6835 Body mass index (BMI) 35.0-35.9, adult: Secondary | ICD-10-CM | POA: Diagnosis not present

## 2024-02-17 DIAGNOSIS — I700 Atherosclerosis of aorta: Secondary | ICD-10-CM | POA: Diagnosis not present

## 2024-02-17 DIAGNOSIS — Z1339 Encounter for screening examination for other mental health and behavioral disorders: Secondary | ICD-10-CM | POA: Diagnosis not present

## 2024-02-17 DIAGNOSIS — Z1331 Encounter for screening for depression: Secondary | ICD-10-CM | POA: Diagnosis not present

## 2024-02-17 DIAGNOSIS — T148XXA Other injury of unspecified body region, initial encounter: Secondary | ICD-10-CM | POA: Diagnosis not present

## 2024-02-17 DIAGNOSIS — I10 Essential (primary) hypertension: Secondary | ICD-10-CM | POA: Diagnosis not present

## 2024-02-17 DIAGNOSIS — J302 Other seasonal allergic rhinitis: Secondary | ICD-10-CM | POA: Diagnosis not present

## 2024-02-17 DIAGNOSIS — Z1389 Encounter for screening for other disorder: Secondary | ICD-10-CM | POA: Diagnosis not present

## 2024-02-17 DIAGNOSIS — R3915 Urgency of urination: Secondary | ICD-10-CM | POA: Diagnosis not present

## 2024-03-02 ENCOUNTER — Ambulatory Visit (INDEPENDENT_AMBULATORY_CARE_PROVIDER_SITE_OTHER): Admitting: Podiatry

## 2024-03-02 ENCOUNTER — Encounter: Payer: Self-pay | Admitting: Podiatry

## 2024-03-02 DIAGNOSIS — B351 Tinea unguium: Secondary | ICD-10-CM

## 2024-03-02 DIAGNOSIS — M79675 Pain in left toe(s): Secondary | ICD-10-CM

## 2024-03-02 DIAGNOSIS — M79674 Pain in right toe(s): Secondary | ICD-10-CM | POA: Diagnosis not present

## 2024-03-02 NOTE — Progress Notes (Unsigned)
  Subjective:  Patient ID: Danielle Sexton, female    DOB: Mar 25, 1946,  MRN: [id number]  78 y.o. female presents {jgcomplaint:23593}  Chief Complaint  Patient presents with   RFC    Diabetes: no Last A1c: n/a Last FSBS: n/a Anticoagulant: none PCP Name & Last Visit:  D. Daye, FNP/ 01/28/24    New problem(s): None {jgcomplaint:23593}  PCP is Daye, Deneda T, FNP , and last visit was {Time; dates multiple:15870}.  Allergies  Allergen Reactions   Latex Swelling    Review of Systems: Negative except as noted in the HPI.   Objective:  [name] is a pleasant 79 y.o. female {jgbodyhabitus:24098} AAO x 3.  Vascular Examination: Vascular status intact b/l with palpable pedal pulses. CFT immediate b/l. Pedal hair present. No edema. No pain with calf compression b/l. Skin temperature gradient WNL b/l. No varicosities noted. No cyanosis or clubbing noted.  Neurological Examination: Sensation grossly intact b/l with 10 gram monofilament. Vibratory sensation intact b/l.  Dermatological Examination: Pedal skin with normal turgor, texture and tone b/l. No open wounds nor interdigital macerations noted. Toenails 1-5 b/l thick, discolored, elongated with subungual debris and pain on dorsal palpation. No hyperkeratotic lesions noted b/l.   Musculoskeletal Examination: Muscle strength 5/5 to b/l LE.  No pain, crepitus noted b/l. No gross pedal deformities. Patient ambulates independently without assistive aids.   Radiographs: None Last A1c:       No data to display           Assessment:  No diagnosis found.  Plan:  {jgplan:23602::"-Patient/POA to call should there be question/concern in the interim."}  Return in about 3 months (around 06/02/2024).  Danielle Sexton, DPM      [place] LOCATION: [address] [place] [address]                   Office [phone]   [facility] LOCATION: [address] [place], [place] [address] Office [phone]

## 2024-05-13 ENCOUNTER — Ambulatory Visit: Admitting: Podiatry

## 2024-05-13 ENCOUNTER — Ambulatory Visit (INDEPENDENT_AMBULATORY_CARE_PROVIDER_SITE_OTHER)

## 2024-05-13 VITALS — Ht 64.0 in | Wt 189.0 lb

## 2024-05-13 DIAGNOSIS — M19072 Primary osteoarthritis, left ankle and foot: Secondary | ICD-10-CM

## 2024-05-13 DIAGNOSIS — R600 Localized edema: Secondary | ICD-10-CM

## 2024-05-13 DIAGNOSIS — M19071 Primary osteoarthritis, right ankle and foot: Secondary | ICD-10-CM

## 2024-05-13 DIAGNOSIS — M7751 Other enthesopathy of right foot: Secondary | ICD-10-CM

## 2024-05-13 DIAGNOSIS — M7752 Other enthesopathy of left foot: Secondary | ICD-10-CM | POA: Diagnosis not present

## 2024-05-13 NOTE — Progress Notes (Signed)
"  °  Subjective:  Patient ID: Danielle Sexton, female    DOB: 1945-11-28,  MRN: [id number]  Chief Complaint  Patient presents with   Foot Pain    Rm 21 patient is here for bilateral foot pain with significant ankle swelling. Patient has tried insoles and new shoes.    Discussed the use of AI scribe software for clinical note transcription with the patient, who gave verbal consent to proceed.  History of Present Illness Danielle Sexton is a 78 year old female who presents with foot swelling and tingling.  She experiences persistent swelling and tingling in her feet, described as 'tingling' and 'numbness' around the toes. No history of diabetes or neuropathy.  Approximately two years ago, she had heel pain related to her Achilles tendon, which has since resolved. She has a history of hip and knee replacements and is scheduled for a right knee replacement soon. No history of blood clots in her legs and reports good circulation. Blood work conducted by her primary care physician returned normal results.  Her past medical history includes high blood pressure and arthritis, for which she has undergone hip replacements. She acknowledges the presence of varicose and spider veins, which she has not previously consulted a specialist about.      Objective:    Physical Exam VASCULAR: Good arterial circulation. DP and PT pulse palpable. Foot is warm and well-perfused. Capillary fill time is brisk. DERMATOLOGIC: Normal skin turgor, texture, and temperature. No open lesions, rashes, or ulcerations. NEUROLOGIC: Normal sensation to light touch and pressure. No paresthesias on examination. ORTHOPEDIC: Pes planus deformity with bilateral +1 pitting edema, left worse than right. Smooth, pain-free range of motion of MTP, subtalar, and midtarsal joints. No ecchymosis or bruising. No gross deformity. No pain to palpation.   No images are attached to the encounter.    Results RADIOLOGY Foot  radiograph: Pes planus deformity, naviculocuneiform () and tarsometatarsal (TMT) arthritis changes   Assessment:   1. Arthritis of both midfeet   2. Peripheral edema      Plan:  Patient was evaluated and treated and all questions answered.  Assessment and Plan Assessment & Plan Bilateral foot and midfoot osteoarthritis Mild osteoarthritis in the arch and midfoot bilaterally, not currently severe or particularly painful. Swelling may be attributed to arthritis among other factors. - Monitor for increased pain in the top of the arch. - Consider cortisone injection if significant pain develops.  Bilateral pes planus deformity Pes planus deformity present bilaterally.  Bilateral lower extremity edema Chronic edema likely multifactorial, related to arthritis, previous surgeries, and vein function. Good arterial circulation. No history of blood clots. - Recommend wearing compression stockings to manage swelling. - Encourage staying active and walking to help reduce edema.  Bilateral lower extremity chronic venous insufficiency with varicose and spider veins Chronic venous insufficiency suspected as a contributing factor to edema. Presence of varicose and spider veins. No prior evaluation by a vein specialist. - Refer to vein specialist, Dr. Audrey at [facility], for evaluation and management of venous insufficiency.  Peripheral neuropathy of bilateral feet Peripheral neuropathy suspected due to tingling and numbness in the toes, likely age-related nerve dysfunction. Not currently painful, so medication not indicated. - Continue normal activities and monitor symptoms.      No follow-ups on file.   "

## 2024-06-09 ENCOUNTER — Ambulatory Visit: Admitting: Podiatry

## 2024-06-09 ENCOUNTER — Encounter: Payer: Self-pay | Admitting: Podiatry

## 2024-06-09 DIAGNOSIS — M79675 Pain in left toe(s): Secondary | ICD-10-CM

## 2024-06-09 DIAGNOSIS — B351 Tinea unguium: Secondary | ICD-10-CM | POA: Diagnosis not present

## 2024-06-09 DIAGNOSIS — M79674 Pain in right toe(s): Secondary | ICD-10-CM | POA: Diagnosis not present

## 2024-06-09 NOTE — Progress Notes (Signed)
  Subjective:  Patient ID: Danielle Sexton, female    DOB: June 09, 1946,  MRN: [id number]  Danielle Sexton presents to clinic today for painful thick toenails that are difficult to trim. Pain interferes with ambulation. Aggravating factors include wearing enclosed shoe gear. Pain is relieved with periodic professional debridement. She states her feet feel better as she has been wearing supportive shoes and inserts. 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 Patient feels all of her concerns were addressed. Chief Complaint  Patient presents with   RFC     RFC Toenail trim LOV with PCP 05/2024.   New problem(s): None.   PCP is Rik Glinda DASEN, FNP.  Allergies  Allergen Reactions   Latex Swelling    Review of Systems: Negative except as noted in the HPI.  Objective: No changes noted in today's physical examination. There were no vitals filed for this visit. Danielle Sexton is a pleasant 78 y.o. female in NAD. AAO x 3.  Vascular Examination: Vascular status intact b/l with palpable pedal pulses. CFT immediate b/l. Pedal hair present. No edema. No pain with calf compression b/l. Skin temperature gradient WNL b/l. No varicosities noted. No cyanosis or clubbing noted.  Neurological Examination: Sensation grossly intact b/l with 10 gram monofilament. Vibratory sensation intact b/l.  Dermatological Examination: Pedal skin with normal turgor, texture and tone b/l. No open wounds nor interdigital macerations noted. Toenails 1-5 b/l thick, discolored, elongated with subungual debris and pain on dorsal palpation. No hyperkeratotic lesions noted b/l.   Musculoskeletal Examination: Muscle strength 5/5 to b/l LE.  No pain, crepitus noted b/l. 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 Pes planus deformity noted bilateral LE.  Radiographs: None  Assessment/Plan: 1. Pain due to onychomycosis of toenails of both feet   Consent given for treatment. Patient examined. All  patient's and/or POA's questions/concerns addressed on today's visit. Toenails 1-5 debrided in length and girth without incident. Continue foot and shoe inspections daily. Monitor blood glucose per PCP/Endocrinologist's recommendations. Continue soft, supportive shoe gear daily. Report any pedal injuries to medical professional. Call office if there are any questions/concerns. -Patient/POA to call should there be question/concern in the interim.   Return in about 3 months (around 09/09/2024).  Danielle Sexton, DPM       LOCATION: [address] [place] [address]                   Office [phone]   [facility] LOCATION: [address] [place], [place] [address] Office [phone]

## 2024-09-21 ENCOUNTER — Ambulatory Visit: Admitting: Podiatry

## 2024-12-30 ENCOUNTER — Ambulatory Visit (HOSPITAL_COMMUNITY): Admit: 2024-12-30 | Admitting: Orthopedic Surgery

## 2024-12-30 SURGERY — ARTHROPLASTY, KNEE, TOTAL
Anesthesia: Spinal | Site: Knee | Laterality: Right

## 2025-02-08 ENCOUNTER — Ambulatory Visit: Admitting: Podiatry
